# Patient Record
Sex: Male | Born: 1990 | Race: Black or African American | Hispanic: No | Marital: Single | Smoking: Former smoker
Health system: Southern US, Community
[De-identification: ages and names within clinical notes are randomized; demographics above are authoritative.]

## PROBLEM LIST (undated history)

## (undated) DIAGNOSIS — G4733 Obstructive sleep apnea (adult) (pediatric): Secondary | ICD-10-CM

## (undated) DIAGNOSIS — I10 Essential (primary) hypertension: Secondary | ICD-10-CM

## (undated) DIAGNOSIS — F419 Anxiety disorder, unspecified: Secondary | ICD-10-CM

## (undated) DIAGNOSIS — E785 Hyperlipidemia, unspecified: Secondary | ICD-10-CM

## (undated) DIAGNOSIS — R7989 Other specified abnormal findings of blood chemistry: Secondary | ICD-10-CM

## (undated) HISTORY — DX: Essential (primary) hypertension: I10

## (undated) HISTORY — DX: Hyperlipidemia, unspecified: E78.5

## (undated) HISTORY — DX: Obstructive sleep apnea (adult) (pediatric): G47.33

## (undated) HISTORY — DX: Other specified abnormal findings of blood chemistry: R79.89

---

## 2015-03-25 ENCOUNTER — Encounter (HOSPITAL_COMMUNITY): Payer: Self-pay | Admitting: *Deleted

## 2015-03-25 ENCOUNTER — Emergency Department (HOSPITAL_COMMUNITY)
Admission: EM | Admit: 2015-03-25 | Discharge: 2015-03-25 | Disposition: A | Discharge status: Home / Self Care | Payer: Managed Care, Other (non HMO) | Attending: Emergency Medicine | Admitting: Emergency Medicine

## 2015-03-25 DIAGNOSIS — X501XXA Overexertion from prolonged static or awkward postures, initial encounter: Secondary | ICD-10-CM | POA: Insufficient documentation

## 2015-03-25 DIAGNOSIS — F17200 Nicotine dependence, unspecified, uncomplicated: Secondary | ICD-10-CM | POA: Insufficient documentation

## 2015-03-25 DIAGNOSIS — Y9289 Other specified places as the place of occurrence of the external cause: Secondary | ICD-10-CM | POA: Diagnosis not present

## 2015-03-25 DIAGNOSIS — Y9389 Activity, other specified: Secondary | ICD-10-CM | POA: Diagnosis not present

## 2015-03-25 DIAGNOSIS — S3992XA Unspecified injury of lower back, initial encounter: Secondary | ICD-10-CM | POA: Diagnosis present

## 2015-03-25 DIAGNOSIS — Y990 Civilian activity done for income or pay: Secondary | ICD-10-CM | POA: Insufficient documentation

## 2015-03-25 DIAGNOSIS — S39012A Strain of muscle, fascia and tendon of lower back, initial encounter: Secondary | ICD-10-CM

## 2015-03-25 MED ORDER — TRAMADOL HCL 50 MG PO TABS
50.0000 mg | ORAL_TABLET | Freq: Once | ORAL | Status: AC
Start: 2015-03-25 — End: 2015-03-25
  Administered 2015-03-25: 50 mg via ORAL
  Filled 2015-03-25: qty 1

## 2015-03-25 MED ORDER — NAPROXEN 500 MG PO TABS: 500.0000 mg | ORAL_TABLET | Freq: Two times a day (BID) | ORAL | Status: DC

## 2015-03-25 MED ORDER — CYCLOBENZAPRINE HCL 10 MG PO TABS: 10.0000 mg | ORAL_TABLET | Freq: Two times a day (BID) | ORAL | Status: DC | PRN

## 2015-03-25 NOTE — ED Notes (Signed)
The pt is c/o back pain while at work  He has ahistory of back problems

## 2015-03-25 NOTE — Discharge Instructions (Signed)

## 2015-03-25 NOTE — ED Provider Notes (Signed)
CSN: [id number]     Arrival date & time 03/25/15  0425 History   First MD Initiated Contact with Patient 03/25/15 [phone]     Chief Complaint  Patient presents with  . Back Pain     (Consider location/radiation/quality/duration/timing/severity/associated sxs/prior Treatment) HPI  Patient to the ER with complaints of back pain. The pain started while he was at work. He works at a [employer] center and reports doing a lot of heavy lifting. He did not do any heaving lifting this evening however. He reports he was sitting down to watch the football game and went from sitting to standing when he developed a severe left low back pain. He reports hx of the same 2 years ago that took 2 months to get better. He is new to the area from out of town and does not have a doctor here. He does not have pain that radiates down his legs. He does not have numbness or tingling to his legs. His legs are not giving out nor do they feel weak or decreased sensation. He denies bowel or bladder incontinence. He denies any other symptoms.  History reviewed. No pertinent past medical history. History reviewed. No pertinent past surgical history. No family history on file. Social History  Substance Use Topics  . Smoking status: Current Every Day Smoker  . Smokeless tobacco: None  . Alcohol Use: Yes    Review of Systems  Review of Systems All other systems negative except as documented in the HPI. All pertinent positives and negatives as reviewed in the HPI.   Allergies  Review of patient's allergies indicates no known allergies.  Home Medications   Prior to Admission medications   Medication Sig Start Date End Date Taking? Authorizing Provider  cyclobenzaprine (FLEXERIL) 10 MG tablet Take 1 tablet (10 mg total) by mouth 2 (two) times daily as needed for muscle spasms. 03/25/15   Shaylon Aden Neva Seat, PA-C  naproxen (NAPROSYN) 500 MG tablet Take 1 tablet (500 mg total) by mouth 2 (two) times daily. 03/25/15    Cheyenna Pankowski Neva Seat, PA-C   BP 139/67 mmHg  Pulse 96  Temp(Src) 98.7 F (37.1 C) (Oral)  Resp 22  Wt 189.859 kg  SpO2 97% Physical Exam  Constitutional: He appears well-developed and well-nourished. No distress.  HENT:  Head: Normocephalic and atraumatic.  Eyes: Pupils are equal, round, and reactive to light.  Neck: Normal range of motion. Neck supple.  Cardiovascular: Normal rate and regular rhythm.   Pulmonary/Chest: Effort normal.  Abdominal: Soft.  Musculoskeletal:  Symmetrical and physiologic strength to bilateral lower extremities.  Neurosensory function adequate to both legs Skin color is normal. Skin is warm and moist.  No step off deformity appreciated and no midline bony tenderness.  Ambulatory  No crepitus, laceration, effusion, induration, lesions Pedal pulses are symmetrical and palpable bilaterally  No tenderness to midline, positive tenderness to paraspinal muscles  Neurological: He is alert.  Skin: Skin is warm and dry.  Nursing note and vitals reviewed.   ED Course  Procedures (including critical care time) Labs Review Labs Reviewed - No data to display  Imaging Review No results found. I have personally reviewed and evaluated these images and lab results as part of my medical decision-making.   EKG Interpretation None      MDM   Final diagnoses:  Lumbar strain, initial encounter   25 y.o.Alexander [name]'s  with back pain.   No neurological deficits and normal neuro exam. No loss of bowel or bladder control.  No concern for cauda equina at this time base on HPI and physical exam findings. No fever, night sweats, weight loss, h/o cancer, IVDU. The patient can walk with some discomfort.   Patient Plan 1. Medications: NSAIDs and/or muscle relaxer. Cont usual home medications unless otherwise directed. 2. Treatment: rest, drink plenty of fluids, gentle stretching as discussed, alternate ice and heat  3. Follow Up: Please followup with your primary  doctor for discussion of your diagnoses and further evaluation after today's visit; if you do not have a primary care doctor use the resource guide provided to find one  Advised to follow-up with the orthopedist if symptoms do not start to resolve in the next 2-3 days. If develop loss of bowel or urinary control return to the ED as soon as possible for further evaluation. To take the medications as prescribed as they can cause harm if not taken appropriately.   Vital signs are stable at discharge. Filed Vitals:   03/25/15 0429  BP: 139/67  Pulse: 96  Temp: 98.7 F (37.1 C)  Resp: 22    Patient/guardian has voiced understanding and agreed to follow-up with the PCP or specialist.         Marlon Pel, PA-C 03/25/15 1610  Shon Baton, MD 03/27/15 [phone]

## 2015-03-29 ENCOUNTER — Emergency Department (HOSPITAL_COMMUNITY)
Admission: EM | Admit: 2015-03-29 | Discharge: 2015-03-29 | Disposition: A | Discharge status: Home / Self Care | Payer: Managed Care, Other (non HMO) | Attending: Emergency Medicine | Admitting: Emergency Medicine

## 2015-03-29 ENCOUNTER — Encounter (HOSPITAL_COMMUNITY): Payer: Self-pay | Admitting: Nurse Practitioner

## 2015-03-29 DIAGNOSIS — F17200 Nicotine dependence, unspecified, uncomplicated: Secondary | ICD-10-CM | POA: Insufficient documentation

## 2015-03-29 DIAGNOSIS — M5441 Lumbago with sciatica, right side: Secondary | ICD-10-CM | POA: Diagnosis not present

## 2015-03-29 DIAGNOSIS — M545 Low back pain: Secondary | ICD-10-CM | POA: Diagnosis present

## 2015-03-29 DIAGNOSIS — Z791 Long term (current) use of non-steroidal anti-inflammatories (NSAID): Secondary | ICD-10-CM | POA: Diagnosis not present

## 2015-03-29 NOTE — Discharge Instructions (Signed)
Mr. Alexander [name],  Nice meeting you! Please follow-up with a primary care provider. Return to the emergency department if these control of your bowel or bladder, develop fevers, chills, inability to walk. Feel better soon!  S. Lane Hacker, PA-C Back Exercises The following exercises strengthen the muscles that help to support the back. They also help to keep the lower back flexible. Doing these exercises can help to prevent back pain or lessen existing pain. If you have back pain or discomfort, try doing these exercises 2-3 times each day or as told by your health care provider. When the pain goes away, do them once each day, but increase the number of times that you repeat the steps for each exercise (do more repetitions). If you do not have back pain or discomfort, do these exercises once each day or as told by your health care provider. EXERCISES Single Knee to Chest Repeat these steps 3-5 times for each leg:  Lie on your back on a firm bed or the floor with your legs extended.  Bring one knee to your chest. Your other leg should stay extended and in contact with the floor.  Hold your knee in place by grabbing your knee or thigh.  Pull on your knee until you feel a gentle stretch in your lower back.  Hold the stretch for 10-30 seconds.  Slowly release and straighten your leg. Pelvic Tilt Repeat these steps 5-10 times:  Lie on your back on a firm bed or the floor with your legs extended.  Bend your knees so they are pointing toward the ceiling and your feet are flat on the floor.  Tighten your lower abdominal muscles to press your lower back against the floor. This motion will tilt your pelvis so your tailbone points up toward the ceiling instead of pointing to your feet or the floor.  With gentle tension and even breathing, hold this position for 5-10 seconds. Cat-Cow Repeat these steps until your lower back becomes more flexible:  Get into a hands-and-knees position on a  firm surface. Keep your hands under your shoulders, and keep your knees under your hips. You may place padding under your knees for comfort.  Let your head hang down, and point your tailbone toward the floor so your lower back becomes rounded like the back of a cat.  Hold this position for 5 seconds.  Slowly lift your head and point your tailbone up toward the ceiling so your back forms a sagging arch like the back of a cow.  Hold this position for 5 seconds. Press-Ups Repeat these steps 5-10 times: 1. Lie on your abdomen (face-down) on the floor. 2. Place your palms near your head, about shoulder-width apart. 3. While you keep your back as relaxed as possible and keep your hips on the floor, slowly straighten your arms to raise the top half of your body and lift your shoulders. Do not use your back muscles to raise your upper torso. You may adjust the placement of your hands to make yourself more comfortable. 4. Hold this position for 5 seconds while you keep your back relaxed. 5. Slowly return to lying flat on the floor. Bridges Repeat these steps 10 times: 1. Lie on your back on a firm surface. 2. Bend your knees so they are pointing toward the ceiling and your feet are flat on the floor. 3. Tighten your buttocks muscles and lift your buttocks off of the floor until your waist is at almost the same height  as your knees. You should feel the muscles working in your buttocks and the back of your thighs. If you do not feel these muscles, slide your feet 1-2 inches farther away from your buttocks. 4. Hold this position for 3-5 seconds. 5. Slowly lower your hips to the starting position, and allow your buttocks muscles to relax completely. If this exercise is too easy, try doing it with your arms crossed over your chest. Abdominal Crunches Repeat these steps 5-10 times: 1. Lie on your back on a firm bed or the floor with your legs extended. 2. Bend your knees so they are pointing toward the  ceiling and your feet are flat on the floor. 3. Cross your arms over your chest. 4. Tip your chin slightly toward your chest without bending your neck. 5. Tighten your abdominal muscles and slowly raise your trunk (torso) high enough to lift your shoulder blades a tiny bit off of the floor. Avoid raising your torso higher than that, because it can put too much stress on your low back and it does not help to strengthen your abdominal muscles. 6. Slowly return to your starting position. Back Lifts Repeat these steps 5-10 times: 1. Lie on your abdomen (face-down) with your arms at your sides, and rest your forehead on the floor. 2. Tighten the muscles in your legs and your buttocks. 3. Slowly lift your chest off of the floor while you keep your hips pressed to the floor. Keep the back of your head in line with the curve in your back. Your eyes should be looking at the floor. 4. Hold this position for 3-5 seconds. 5. Slowly return to your starting position. SEEK MEDICAL CARE IF:  Your back pain or discomfort gets much worse when you do an exercise.  Your back pain or discomfort does not lessen within 2 hours after you exercise. If you have any of these problems, stop doing these exercises right away. Do not do them again unless your health care provider says that you can. SEEK IMMEDIATE MEDICAL CARE IF:  You develop sudden, severe back pain. If this happens, stop doing the exercises right away. Do not do them again unless your health care provider says that you can.   This information is not intended to replace advice given to you by your health care provider. Make sure you discuss any questions you have with your health care provider.   Document Released: 03/12/2004 Document Revised: 10/24/2014 Document Reviewed: 03/29/2014 Elsevier Interactive Patient Education 2016 [employer]  Chronic Back Pain  When back pain lasts longer than 3 months, it is called chronic back pain.People with  chronic back pain often go through certain periods that are more intense (flare-ups).  CAUSES Chronic back pain can be caused by wear and tear (degeneration) on different structures in your back. These structures include:  The bones of your spine (vertebrae) and the joints surrounding your spinal cord and nerve roots (facets).  The strong, fibrous tissues that connect your vertebrae (ligaments). Degeneration of these structures may result in pressure on your nerves. This can lead to constant pain. HOME CARE INSTRUCTIONS  Avoid bending, heavy lifting, prolonged sitting, and activities which make the problem worse.  Take brief periods of rest throughout the day to reduce your pain. Lying down or standing usually is better than sitting while you are resting.  Take over-the-counter or prescription medicines only as directed by your caregiver. SEEK IMMEDIATE MEDICAL CARE IF:   You have weakness or numbness in  one of your legs or feet.  You have trouble controlling your bladder or bowels.  You have nausea, vomiting, abdominal pain, shortness of breath, or fainting.   This information is not intended to replace advice given to you by your health care provider. Make sure you discuss any questions you have with your health care provider.   Document Released: 03/12/2004 Document Revised: 04/27/2011 Document Reviewed: 07/23/2014 Elsevier Interactive Patient Education [employer].

## 2015-03-29 NOTE — ED Notes (Signed)
See PA Assessment  

## 2015-03-29 NOTE — ED Notes (Signed)
He was here Sunday for back pain and discharged home with naproxen and flexeril. He has been taking the meds with minimal pain relief and states he is unable to go back to work due to pain. He is ambulatory, mae.

## 2015-03-29 NOTE — ED Provider Notes (Signed)
CSN: [id number]     Arrival date & time 03/29/15  1815 History  By signing my name below, I, Alexander [name], attest that this documentation has been prepared under the direction and in the presence of Melton Krebs PA-C. Electronically Signed: Placido [name], ED Scribe. 03/29/2015. 6:48 PM.    Chief Complaint  Patient presents with  . Back Pain    The history is provided by the patient. No language interpreter was used.    HPI Comments: Alexander [name] is a 25 y.o. male who presents to the Emergency Department complaining of waxing and waning, moderate, right lower back pain with onset 1 week ago. He describes his pain as 8/10 pain scale, constant. Pt was seen 4 days ago and discharged with naproxen and flexeril which he states has provided little relief and has caused him to continue to be out of work. His pain worsens when sitting erect or to the right and alleviates slightly while ambulating. He notes a radiation of pain to his right hip. He works at a distribution center doing heavy lifting. Pt notes a PMHx of lumbar strain 2 years ago. He denies any associated symptoms at this time.   No past medical history on file. No past surgical history on file. No family history on file. Social History  Substance Use Topics  . Smoking status: Current Every Day Smoker  . Smokeless tobacco: Not on file  . Alcohol Use: Yes    Review of Systems A complete 10 system review of systems was obtained and all systems are negative except as noted in the HPI and PMH.     Allergies  Review of patient's allergies indicates no known allergies.  Home Medications   Prior to Admission medications   Medication Sig Start Date End Date Taking? Authorizing Provider  cyclobenzaprine (FLEXERIL) 10 MG tablet Take 1 tablet (10 mg total) by mouth 2 (two) times daily as needed for muscle spasms. 03/25/15   Tiffany Neva Seat, PA-C  naproxen (NAPROSYN) 500 MG tablet Take 1 tablet (500 mg total) by mouth 2 (two)  times daily. 03/25/15   Tiffany Neva Seat, PA-C   BP 155/85 mmHg  Pulse 92  Temp(Src) 98 F (36.7 C) (Oral)  Resp 18  Ht  (1.93 m)  Wt 415 lb (188.243 kg)  BMI 50.54 kg/m2  SpO2 96% Physical Exam Constitutional: Pt appears well-developed and well-nourished. No distress.  HENT:  Head: Normocephalic and atraumatic.  Mouth/Throat: Oropharynx is clear and moist. No oropharyngeal exudate.  Eyes: Conjunctivae are normal.  Neck: Normal range of motion. Neck supple.  Full ROM without pain  Cardiovascular: Normal rate, regular rhythm and intact distal pulses.   Pulmonary/Chest: Effort normal and breath sounds normal. No respiratory distress. Pt has no wheezes.  Abdominal: Soft. Pt exhibits no distension. There is no tenderness.  Musculoskeletal:  Full range of motion of the T-spine and L-spine No tenderness to palpation of the spinous processes of the T-spine or L-spine Mild tenderness to palpation of the paraspinous muscles of the L-spine. Tenderness over the right SI joint.   Lymphadenopathy:    Pt has no cervical adenopathy.  Neurological: Pt is alert. Pt has normal reflexes.  Reflex Scores:      Bicep reflexes are 2+ on the right side and 2+ on the left side.      Brachioradialis reflexes are 2+ on the right side and 2+ on the left side.      Patellar reflexes are 2+ on the right side and  2+ on the left side.      Achilles reflexes are 2+ on the right side and 2+ on the left side. Speech is clear and goal oriented, follows commands Normal 5/5 strength in upper and lower extremities bilaterally including dorsiflexion and plantar flexion, strong and equal grip strength Sensation normal to light and sharp touch Moves extremities without ataxia, coordination intact Normal gait Normal balance No Clonus  Skin: Skin is warm and dry. No rash noted. Pt is not diaphoretic. No erythema.  Psychiatric: Pt has a normal mood and affect. Behavior is normal.  Nursing note and vitals  reviewed.   ED Course  Procedures  DIAGNOSTIC STUDIES: Oxygen Saturation is 96% on RA, normal by my interpretation.    COORDINATION OF CARE: 6:44 PM Discussed next steps with pt. He verbalized understanding and is agreeable with the plan.   MDM   Final diagnoses:  Low back pain with right-sided sciatica, unspecified back pain laterality   Patient with back pain.  No neurological deficits and normal neuro exam.  Patient is ambulatory.  No loss of bowel or bladder control.  No concern for cauda equina.  No fever, night sweats, weight loss, h/o cancer, IVDA, no recent procedure to back. No urinary symptoms suggestive of UTI.  Supportive care and return precaution discussed. Appears safe for discharge at this time. Follow up as indicated in discharge paperwork.   I personally performed the services described in this documentation, which was scribed in my presence. The recorded information has been reviewed and is accurate.    Melton Krebs, PA-C 03/30/15 1191  Benjiman Core, MD 03/30/15 [phone]

## 2015-10-10 ENCOUNTER — Ambulatory Visit (HOSPITAL_COMMUNITY)
Admission: EM | Admit: 2015-10-10 | Discharge: 2015-10-10 | Disposition: A | Discharge status: Home / Self Care | Payer: Managed Care, Other (non HMO) | Attending: Emergency Medicine | Admitting: Emergency Medicine

## 2015-10-10 ENCOUNTER — Encounter (HOSPITAL_COMMUNITY): Payer: Self-pay | Admitting: Emergency Medicine

## 2015-10-10 DIAGNOSIS — K529 Noninfective gastroenteritis and colitis, unspecified: Secondary | ICD-10-CM | POA: Diagnosis not present

## 2015-10-10 MED ORDER — ONDANSETRON 4 MG PO TBDP
ORAL_TABLET | ORAL | Status: AC
  Filled 2015-10-10: qty 1

## 2015-10-10 MED ORDER — ONDANSETRON HCL 4 MG PO TABS: 4.0000 mg | ORAL_TABLET | Freq: Three times a day (TID) | ORAL | 0 refills | Status: DC | PRN

## 2015-10-10 MED ORDER — ONDANSETRON HCL 4 MG/2ML IJ SOLN: 4.0000 mg | Freq: Once | INTRAMUSCULAR | Status: DC

## 2015-10-10 MED ORDER — ONDANSETRON 4 MG PO TBDP
4.0000 mg | ORAL_TABLET | Freq: Once | ORAL | Status: AC
Start: 2015-10-10 — End: 2015-10-10
  Administered 2015-10-10: 4 mg via ORAL

## 2015-10-10 NOTE — ED Provider Notes (Signed)
CSN: [id number]     Arrival date & time 10/10/15  1628 History   First MD Initiated Contact with Patient 10/10/15 1735     Chief Complaint  Patient presents with  . Abdominal Pain   (Consider location/radiation/quality/duration/timing/severity/associated sxs/prior Treatment) 25 year old male presents with nausea, vomiting and abdominal cramping that started last night. No fever. Daughter had similar symptoms yesterday and is recovering and feeling better today. He has not been able to keep down any food or fluids today. Takes no daily medication and no chronic health issues. However, his blood pressure is elevated today with no history of elevated BP. He recently quit smoking within the past 8 months but has gained weight.    The history is provided by the patient.    History reviewed. No pertinent past medical history. History reviewed. No pertinent surgical history. History reviewed. No pertinent family history. Social History  Substance Use Topics  . Smoking status: Former Smoker    Packs/day: 1.00    Years: 10.00    Types: Cigarettes    Quit date: 03/12/2015  . Smokeless tobacco: Never Used  . Alcohol use Yes    Review of Systems  Constitutional: Positive for fatigue. Negative for chills and fever.  Respiratory: Negative for shortness of breath.   Cardiovascular: Negative for chest pain.  Gastrointestinal: Positive for diarrhea, nausea and vomiting. Negative for blood in stool and constipation. Abdominal pain: cramping.  Genitourinary: Negative for dysuria.  Neurological: Negative for dizziness and headaches.    Allergies  Review of patient's allergies indicates no known allergies.  Home Medications   Prior to Admission medications   Medication Sig Start Date End Date Taking? Authorizing Provider  ondansetron (ZOFRAN) 4 MG tablet Take 1 tablet (4 mg total) by mouth every 8 (eight) hours as needed for nausea or vomiting. 10/10/15   Sudie GrumblingAnn Berry Vijay Durflinger, NP   Meds Ordered and  Administered this Visit   Medications  ondansetron (ZOFRAN-ODT) disintegrating tablet 4 mg (4 mg Oral Given 10/10/15 1755)    BP 145/81 (BP Location: Right Arm)   Pulse 82   Temp 98.4 F (36.9 C) (Oral)   Resp 18   SpO2 95%  No data found.   Physical Exam  Constitutional: He is oriented to person, place, and time. He appears well-developed and well-nourished. No distress.  HENT:  Head: Normocephalic and atraumatic.  Nose: Nose normal.  Mouth/Throat: Uvula is midline, oropharynx is clear and moist and mucous membranes are normal.  Neck: Normal range of motion. Neck supple.  Cardiovascular: Normal rate, regular rhythm and normal heart sounds.   Pulmonary/Chest: Effort normal and breath sounds normal.  Abdominal: Soft. Normal appearance and bowel sounds are normal. There is no tenderness. There is no rigidity and no CVA tenderness.  Lymphadenopathy:    He has no cervical adenopathy.  Neurological: He is alert and oriented to person, place, and time.  Skin: Skin is warm and dry. Capillary refill takes less than 2 seconds.  Psychiatric: He has a normal mood and affect. His behavior is normal. Judgment and thought content normal.    Urgent Care Course   Clinical Course    Procedures (including critical care time)  Labs Review Labs Reviewed - No data to display  Imaging Review No results found.   Visual Acuity Review  Right Eye Distance:   Left Eye Distance:   Bilateral Distance:    Right Eye Near:   Left Eye Near:    Bilateral Near:  MDM   1. Gastroenteritis    Discussed that symptoms probably are due to a virus. Recommend Zofran 8mg  IM today- however when nurse came in to give shot- pt declined and was given Zofran 4mg  oral tablet instead. May continue Zofran 4mg  every 8 hours as needed. Slowly increase fluids and advance diet as tolerated. Diarrhea should resolve on own without medication. Go to ER if vomiting or abdominal pain worsens. We rechecked  BP at end of visit- had improved to 145/81. Encouraged to continue to monitor and increase exercise to help lose weight. Follow-up with a primary care provider if blood pressure continues to remain elevated or if he needs additional assistance with weight loss.      Sudie GrumblingAnn Berry Etsuko Dierolf, NP 10/11/15 [phone]

## 2015-10-10 NOTE — ED Triage Notes (Signed)
[facility] with a complaint of mild abdominal pain with N/V/D that started last night.

## 2015-10-11 NOTE — Discharge Instructions (Signed)
You were given Zofran tablet today to help with nausea and vomiting. You may continue this medication every 8 hours as needed. Slowly increase fluids and start with bland diet. Continue to monitor blood pressure and follow-up with a primary care provider if blood pressure remains elevated or you need additional assistance to help with weight loss.

## 2016-07-01 ENCOUNTER — Encounter (HOSPITAL_COMMUNITY): Payer: Self-pay | Admitting: *Deleted

## 2016-07-01 ENCOUNTER — Emergency Department (HOSPITAL_COMMUNITY)
Admission: EM | Admit: 2016-07-01 | Discharge: 2016-07-01 | Disposition: A | Discharge status: Home / Self Care | Payer: Managed Care, Other (non HMO) | Attending: Emergency Medicine | Admitting: Emergency Medicine

## 2016-07-01 ENCOUNTER — Emergency Department (HOSPITAL_COMMUNITY): Payer: Managed Care, Other (non HMO)

## 2016-07-01 DIAGNOSIS — R0789 Other chest pain: Secondary | ICD-10-CM | POA: Insufficient documentation

## 2016-07-01 DIAGNOSIS — Z87891 Personal history of nicotine dependence: Secondary | ICD-10-CM | POA: Diagnosis not present

## 2016-07-01 HISTORY — DX: Anxiety disorder, unspecified: F41.9

## 2016-07-01 LAB — CBC
HEMATOCRIT: 45.2 % (ref 39.0–52.0)
Hemoglobin: 14.6 g/dL (ref 13.0–17.0)
MCH: 25.6 pg — ABNORMAL LOW (ref 26.0–34.0)
MCHC: 32.3 g/dL (ref 30.0–36.0)
MCV: 79.3 fL (ref 78.0–100.0)
PLATELETS: 231 10*3/uL (ref 150–400)
RBC: 5.7 MIL/uL (ref 4.22–5.81)
RDW: 14.5 % (ref 11.5–15.5)
WBC: 6.7 10*3/uL (ref 4.0–10.5)

## 2016-07-01 LAB — BASIC METABOLIC PANEL
ANION GAP: 10 (ref 5–15)
BUN: 7 mg/dL (ref 6–20)
CHLORIDE: 104 mmol/L (ref 101–111)
CO2: 23 mmol/L (ref 22–32)
Calcium: 9.2 mg/dL (ref 8.9–10.3)
Creatinine, Ser: 0.94 mg/dL (ref 0.61–1.24)
Glucose, Bld: 99 mg/dL (ref 65–99)
POTASSIUM: 3.8 mmol/L (ref 3.5–5.1)
SODIUM: 137 mmol/L (ref 135–145)

## 2016-07-01 LAB — I-STAT TROPONIN, ED: Troponin i, poc: 0 ng/mL (ref 0.00–0.08)

## 2016-07-01 LAB — D-DIMER, QUANTITATIVE: D-Dimer, Quant: 0.29 ug/mL-FEU (ref 0.00–0.50)

## 2016-07-01 MED ORDER — LORAZEPAM 1 MG PO TABS
1.0000 mg | ORAL_TABLET | Freq: Once | ORAL | Status: AC
  Administered 2016-07-01: 1 mg via ORAL
  Filled 2016-07-01: qty 1

## 2016-07-01 MED ORDER — LORAZEPAM 1 MG PO TABS: 1.0000 mg | ORAL_TABLET | Freq: Two times a day (BID) | ORAL | 0 refills | Status: DC | PRN

## 2016-07-01 NOTE — ED Notes (Signed)
We called lab to get the 2nd blood test was unable to get it

## 2016-07-01 NOTE — ED Provider Notes (Signed)
[facility]-EMER[facility]ENCY DEPT Provider Note   CSN: [id number] Arrival date & time: 07/01/16  1252  By signing my name below, I, [name], attest that this documentation has been prepared under the direction and in the presence of Jacalyn Lefevre, MD. Electronically Signed: [name], Scribe. 07/01/2016. 3:52 PM.   History   Chief Complaint Chief Complaint  Patient presents with  . Chest Pain   The history is provided by the patient and medical records. No language interpreter was used.   HPI Comments:  Alexander [name] is an obese 26 y.o. male with a PMHx of Anxiety, who presents to the Emergency Department complaining of gradually improving, sharp, centralized, CP onset this afternoon at work around 12:00PM. Pt reports CP arising during exertional work described as sharp. He notes his left arm went numb alongside some SOB during this episode. He did no try anything to relieve the pain PTA but states his pain is no longer present at the current time. Pt denies abdominal pain, and any other complaints at this time. Pt just started smoking again.   Past Medical History:  Diagnosis Date  . Anxiety   . Obesity    There are no active problems to display for this patient.  History reviewed. No pertinent surgical history.  Home Medications    Prior to Admission medications   Medication Sig Start Date End Date Taking? Authorizing Provider  LORazepam (ATIVAN) 1 MG tablet Take 1 tablet (1 mg total) by mouth 2 (two) times daily as needed for anxiety. 07/01/16   Jacalyn Lefevre, MD  ondansetron (ZOFRAN) 4 MG tablet Take 1 tablet (4 mg total) by mouth every 8 (eight) hours as needed for nausea or vomiting. 10/10/15   Amyot, Ali Lowe, NP   Family History History reviewed. No pertinent family history.  Social History Social History  Substance Use Topics  . Smoking status: Former Smoker    Packs/day: 1.00    Years: 10.00    Types: Cigarettes    Quit date: 03/12/2015  . Smokeless  tobacco: Never Used  . Alcohol use Yes   Allergies   Patient has no known allergies.   Review of Systems Review of Systems All systems reviewed and are negative for acute change except as noted in the HPI.    Physical Exam Updated Vital Signs BP 138/65 (BP Location: Right Arm)   Pulse (!) 110   Temp 98.3 F (36.8 C) (Oral)   Resp 20   SpO2 97%   Physical Exam  Constitutional: He is oriented to person, place, and time. He appears well-developed and well-nourished.  HENT:  Head: Normocephalic.  Eyes: Conjunctivae are normal.  Cardiovascular: Tachycardia present.   Pulmonary/Chest: Effort normal.  Abdominal: He exhibits no distension.  Musculoskeletal: Normal range of motion.  Neurological: He is alert and oriented to person, place, and time.  Skin: Skin is warm and dry.  Psychiatric: He has a normal mood and affect.  Nursing note and vitals reviewed.  ED Treatments / Results  DIAGNOSTIC STUDIES:  Oxygen Saturation is 95% on RA, adequate by my interpretation.    COORDINATION OF CARE:  3:50 PM Discussed treatment plan with pt at bedside including blood work, CXR, EKG, and pt agreed to plan.  Labs (all labs ordered are listed, but only abnormal results are displayed) Labs Reviewed  CBC - Abnormal; Notable for the following:       Result Value   MCH 25.6 (*)    All other components within normal limits  BASIC METABOLIC PANEL  D-DIMER, QUANTITATIVE (NOT AT [facility])  TROPONIN I  I-STAT TROPOININ, ED    EKG  EKG Interpretation  Date/Time:  Wednesday Jul 01 2016 13:04:38 EDT Ventricular Rate:  108 PR Interval:  184 QRS Duration: 90 QT Interval:  320 QTC Calculation: 428 R Axis:   109 Text Interpretation:  Sinus tachycardia Rightward axis Cannot rule out Anterior infarct , age undetermined T wave abnormality, consider inferior ischemia Abnormal ECG No old tracing to compare Confirmed by [facility] MD, Arrianna Catala [phone]([id number]) on 07/01/2016 4:24:37 PM        Radiology Dg  Chest 2 View  Result Date: 07/01/2016 CLINICAL DATA:  Left-sided chest pain.  Shortness breath. EXAM: CHEST  2 VIEW COMPARISON:  No prior. FINDINGS: Mediastinum and hilar structures are normal. Borderline cardiomegaly. Normal pulmonary vascularity. No focal infiltrate. No pleural effusion or pneumothorax . IMPRESSION: 1.  Borderline cardiomegaly.  No pulmonary venous congestion. 2. No focal infiltrate. Electronically Signed   By: Maisie Fushomas  Register   On: 07/01/2016 13:26    Procedures Procedures (including critical care time)  Medications Ordered in ED Medications  LORazepam (ATIVAN) tablet 1 mg (1 mg Oral Given 07/01/16 1706)     Initial Impression / Assessment and Plan / ED Course  I have reviewed the triage vital signs and the nursing notes.  Pertinent labs & imaging results that were available during my care of the patient were reviewed by me and considered in my medical decision making (see chart for details).   Pt's tachycardia has improved.  Pt has been dealing with a recent breakup of 6 years.  Cp likely related to stress.  Pt did not want a 2nd troponin drawn.  He is low risk for cad.  He knows to return if worse.   Final Clinical Impressions(s) / ED Diagnoses   Final diagnoses:  Atypical chest pain    New Prescriptions New Prescriptions   LORAZEPAM (ATIVAN) 1 MG TABLET    Take 1 tablet (1 mg total) by mouth 2 (two) times daily as needed for anxiety.   I personally performed the services described in this documentation, which was scribed in my presence. The recorded information has been reviewed and is accurate.     Jacalyn LefevreHaviland, Zalmen Wrightsman, MD 07/01/16 [phone]

## 2016-07-01 NOTE — ED Notes (Signed)
Declined W/C at D/C and was escorted to lobby by RN. 

## 2016-07-01 NOTE — ED Triage Notes (Signed)
Pt reports onset this am of mid sharp chest pains, had sob when pain started but sob has since resolved. No acute distress is noted at this time.

## 2016-09-16 NOTE — Progress Notes (Signed)
Alexander [name] is a 26 y.o. male here to Establish Care and discuss referral for sleep study.  I acted as a Neurosurgeonscribe for Energy East CorporationSamantha Quran Vasco, PA-C Alexander Mullonna Orphanos, LPN  History of Present Illness:   Chief Complaint  Patient presents with  . Establish Care    [facility]  . Needs referral for Sleep Study  . Snoring    Acute Concerns: Sleep apnea -- hx of "severe" obstructive sleep apnea, dx in July 2016 and was told to get a CPAP but never did pick it up. Having daytime somnolence.  Currently works 1st shift, was on 3rd shift. He is very motivated to start using CPAP again. Recently stopped smoking last month. Morbid obesity -- has lost 35 lb in about 3 months. In March was up 440 lb, got down to 400 lb.  Lost it due to stress and working out. In 2008 (when in high school) was around 315 lb.  Health Maintenance: Weight -- Weight: (!) 417 lb (189.1 kg)    Depression screen PHQ 2/9 09/17/2016  Decreased Interest 0  Down, Depressed, Hopeless 0  PHQ - 2 Score 0    No flowsheet data found.  Other providers/specialists: Does get regular care from a dentist   Past Medical History:  Diagnosis Date  . Anxiety   . Obesity   . Snoring      Social History   Social History  . Marital status: Single    Spouse name: N/A  . Number of children: N/A  . Years of education: N/A   Occupational History  . Not on file.   Social History Main Topics  . Smoking status: Former Smoker    Packs/day: 1.00    Years: 10.00    Types: Cigarettes    Quit date: 07/17/2016  . Smokeless tobacco: Never Used     Comment: Hookah every other day  . Alcohol use 1.8 - 2.4 oz/week    3 - 4 Cans of beer per week  . Drug use: No  . Sexual activity: Yes   Other Topics Concern  . Not on file   Social History Narrative   [employer]   Daughter, 4 y/o -- sees 3-4 times a week       History reviewed. No pertinent surgical history.  Family History  Problem Relation Age of Onset  . Heart  disease Maternal Grandfather   . Hypertension Maternal Grandfather     No Known Allergies   Current Medications:   Current Outpatient Prescriptions:  Marland Kitchen.  Green Tea, Camillia sinensis, (GREEN TEA PO), Take 3 capsules by mouth daily., Disp: , Rfl:    Review of Systems:   Review of Systems  Constitutional: Positive for malaise/fatigue. Negative for chills, fever and weight loss.  Respiratory: Negative for cough and shortness of breath.   Cardiovascular: Negative for chest pain, palpitations and leg swelling.  Neurological: Negative for dizziness and headaches.  Psychiatric/Behavioral: Negative for depression. The patient does not have insomnia.     Vitals:   Vitals:   09/17/16 1522  BP: 130/90  Pulse: 89  Temp: 98.5 F (36.9 C)  TempSrc: Oral  SpO2: 95%  Weight: (!) 417 lb (189.1 kg)  Height: 6' 0.5" (1.842 m)     Body mass index is 55.78 kg/m.  Physical Exam:   Physical Exam  Constitutional: He appears well-developed. He is cooperative.  Non-toxic appearance. He does not have a sickly appearance. He does not appear ill. No distress.  Cardiovascular: Normal rate,  regular rhythm, S1 normal, S2 normal, normal heart sounds and normal pulses.   No LE edema  Pulmonary/Chest: Effort normal and breath sounds normal.  Neurological: He is alert. GCS eye subscore is 4. GCS verbal subscore is 5. GCS motor subscore is 6.  Skin: Skin is warm, dry and intact.  Psychiatric: He has a normal mood and affect. His speech is normal and behavior is normal.  Nursing note and vitals reviewed.     Assessment and Plan:    [name] was seen today for establish care, needs referral for sleep study and snoring.  Diagnoses and all orders for this visit:  Obstructive sleep apnea and Morbid obesity (HCC) Needs repeat, updated sleep study and new prescription for CPAP. Encouraged continued weight loss, follow-up with us as able for a physical and weight loss discussion. -     Home sleep test;  Future    . Reviewed expectations re: course of current medical issues. . Discussed self-management of symptoms. . Outlined signs and symptoms indicating need for more acute intervention. . Patient verbalized understanding and all questions were answered. . See orders for this visit as documented in the electronic medical record. . Patient received an After-Visit Summary.  CMA or LPN served as scribe during this visit. History, Physical, and Plan performed by medical provider. Documentation and orders reviewed and attested to.  Alexander MottoSamantha Clemence Lengyel, PA-C

## 2016-09-17 ENCOUNTER — Ambulatory Visit (INDEPENDENT_AMBULATORY_CARE_PROVIDER_SITE_OTHER): Payer: Managed Care, Other (non HMO) | Admitting: Physician Assistant

## 2016-09-17 ENCOUNTER — Encounter: Payer: Self-pay | Admitting: Physician Assistant

## 2016-09-17 VITALS — BP 130/90 | HR 89 | Temp 98.5°F | Ht 72.5 in | Wt >= 6400 oz

## 2016-09-17 DIAGNOSIS — F419 Anxiety disorder, unspecified: Secondary | ICD-10-CM | POA: Insufficient documentation

## 2016-09-17 DIAGNOSIS — G4733 Obstructive sleep apnea (adult) (pediatric): Secondary | ICD-10-CM | POA: Diagnosis not present

## 2016-09-17 NOTE — Patient Instructions (Signed)
It was great to meet you!  You will be contacted about your sleep study referral. If you have not been contacted about your referral in 1-2 weeks, please let us know.  Please come back for a physical at your convenience.

## 2016-10-14 ENCOUNTER — Telehealth: Payer: Self-pay | Admitting: Internal Medicine

## 2016-10-14 NOTE — Telephone Encounter (Signed)
Initially called [employer] to receive pre-cert when first routed order- I was informed none was need. I called [employer] again this morning and after a lengthy call I got the sleep study clarified, and into review with [employer]. Reference number for [employer]- [id number]. Awaiting approval from [facility] now.

## 2016-10-14 NOTE — Telephone Encounter (Signed)
Patient was told by Pulmonary he needs precertification before he can be scheduled for sleep test.  This was ordered 08/02 by Sam.  Ty,  -LL

## 2016-10-14 NOTE — Telephone Encounter (Signed)
Pulmonary calling to inform us that the patient needs precertification information sent to them before patient can be scheduled.  See below.  Ty,  -LL

## 2016-10-14 NOTE — Telephone Encounter (Signed)
Alexander MottoSamantha Worley PA has put in order for sleep study but we haven't been notified by her office that the study has been precerted.  I called pt & explained to him that normally order is placed by the provider & then their office will get study precerted & then contact our office to let us know order is there & they ask us to schedule pt.  I told him we have not been [facility].  I explained he can call her office & let them know we haven't gotten any type of notification for them & we will be glad to schedule the study once it has been precerted.  I called [facility] myself & spoke to [employer].  She is going to make new referral coordinator aware of the process.  Nothing further needed.

## 2016-10-23 ENCOUNTER — Telehealth: Payer: Self-pay | Admitting: Internal Medicine

## 2016-10-23 NOTE — Telephone Encounter (Signed)
Last [facility] since it was dealing with scheduling and precert, so will route to them, thanks

## 2016-10-23 NOTE — Telephone Encounter (Signed)
Called the patient and  Let him know we are still waiting on his sleep study order from his pcp he wanted to be sure we have his new number and I told him we would call once we got it.

## 2016-11-05 DIAGNOSIS — G4733 Obstructive sleep apnea (adult) (pediatric): Secondary | ICD-10-CM | POA: Diagnosis not present

## 2016-11-06 DIAGNOSIS — G4733 Obstructive sleep apnea (adult) (pediatric): Secondary | ICD-10-CM | POA: Diagnosis not present

## 2016-11-09 ENCOUNTER — Other Ambulatory Visit: Payer: Self-pay | Admitting: *Deleted

## 2016-11-09 DIAGNOSIS — G4733 Obstructive sleep apnea (adult) (pediatric): Secondary | ICD-10-CM

## 2016-11-11 ENCOUNTER — Other Ambulatory Visit: Payer: Self-pay | Admitting: Physician Assistant

## 2016-11-11 DIAGNOSIS — G4733 Obstructive sleep apnea (adult) (pediatric): Secondary | ICD-10-CM

## 2016-11-23 ENCOUNTER — Telehealth: Payer: Self-pay | Admitting: Physician Assistant

## 2016-11-23 NOTE — Telephone Encounter (Signed)
Mellody Dance can you look into this for patient.

## 2016-11-23 NOTE — Telephone Encounter (Signed)
Patient calling to check the status of his sleep study.  I advised the patient that I did see the order in the system and that I would document the note and transferred the call to the referral coordinators voicemail also to advise.

## 2016-12-02 ENCOUNTER — Telehealth: Payer: Self-pay | Admitting: Internal Medicine

## 2016-12-02 ENCOUNTER — Telehealth: Payer: Self-pay | Admitting: Physician Assistant

## 2016-12-02 NOTE — Telephone Encounter (Signed)
DME order completed and sent to [employer]. They will contact patient to continue process.

## 2016-12-02 NOTE — Telephone Encounter (Signed)
[employer] AuerbachC[employer] Corporation called to speak to someone who deals with prior authorizations for the patient. I transferred the call to KendallKeith.

## 2016-12-02 NOTE — Telephone Encounter (Signed)
Noted  

## 2016-12-02 NOTE — Telephone Encounter (Signed)
Called and spoke with pt and he is aware of his results.  These were called to him by his PCP office.

## 2016-12-21 ENCOUNTER — Telehealth: Payer: Self-pay | Admitting: Physician Assistant

## 2016-12-21 NOTE — Telephone Encounter (Signed)
[employer] called in reference to patient needing cpap machine. Cpap machine needs to be ordered from provider. Please advise.

## 2016-12-21 NOTE — Telephone Encounter (Signed)
Mellody DanceKeith, do you know anything about this?

## 2017-02-02 ENCOUNTER — Telehealth: Payer: Self-pay

## 2017-02-04 NOTE — Telephone Encounter (Signed)
Error

## 2017-10-05 ENCOUNTER — Encounter: Payer: Self-pay | Admitting: Physician Assistant

## 2017-10-05 ENCOUNTER — Ambulatory Visit (INDEPENDENT_AMBULATORY_CARE_PROVIDER_SITE_OTHER): Payer: Managed Care, Other (non HMO) | Admitting: Physician Assistant

## 2017-10-05 DIAGNOSIS — G4733 Obstructive sleep apnea (adult) (pediatric): Secondary | ICD-10-CM

## 2017-10-05 NOTE — Patient Instructions (Addendum)
It was great to see you!  I will fax over the new order for [employer] at this time.  Let's follow-up in 3 months for a physical, sooner if you have concerns.  Take care,  Jarold MottoSamantha Dodger Sinning PA-C

## 2017-10-05 NOTE — Progress Notes (Signed)
Alexander [name] is a 27 y.o. male here for a follow up of a pre-existing problem.   History of Present Illness:   Chief Complaint  Patient presents with  . Sleep Apnea    HPI   Sleep apnea -- patient underwent sleep study on 11/05/16 that confirmed sleep apnea. He was then prescribed a CPAP from [employer] in October 2018. He states that he did not use it as much as he was supposed to, so the machine was given to the company. He is here to let us know that he is needing a new prescription for [employer] as he has made some changes in his life and he is hoping to be more compliant with this and start back on his CPAP.   Morbid obesity -- his weight continues to improve. He states that his girlfriend is very "into fitness" and is a better influence. He is getting back into the gym and making better food choices.  Wt Readings from Last 3 Encounters:  10/05/17 (!) 392 lb 9.6 oz (178.1 kg)  09/17/16 (!) 417 lb (189.1 kg)  03/29/15 (!) 415 lb (188.2 kg)      Past Medical History:  Diagnosis Date  . Anxiety   . Obstructive sleep apnea    non compliant see [facility]a      Social History   Socioeconomic History  . Marital status: Single    Spouse name: Not on file  . Number of children: Not on file  . Years of education: Not on file  . Highest education level: Not on file  Occupational History  . Not on file  Social Needs  . Financial resource strain: Not on file  . Food insecurity:    Worry: Not on file    Inability: Not on file  . Transportation needs:    Medical: Not on file    Non-medical: Not on file  Tobacco Use  . Smoking status: Former Smoker    Packs/day: 1.00    Years: 10.00    Pack years: 10.00    Types: Cigarettes    Last attempt to quit: 07/17/2016    Years since quitting: 1.2  . Smokeless tobacco: Never Used  . Tobacco comment: Hookah every other day  Substance and Sexual Activity  . Alcohol use: Yes    Alcohol/week: 3.0 - 4.0 standard drinks    Types: 3 - 4  Cans of beer per week  . Drug use: No  . Sexual activity: Yes  Lifestyle  . Physical activity:    Days per week: Not on file    Minutes per session: Not on file  . Stress: Not on file  Relationships  . Social connections:    Talks on phone: Not on file    Gets together: Not on file    Attends religious service: Not on file    Active member of club or organization: Not on file    Attends meetings of clubs or organizations: Not on file    Relationship status: Not on file  . Intimate partner violence:    Fear of current or ex partner: Not on file    Emotionally abused: Not on file    Physically abused: Not on file    Forced sexual activity: Not on file  Other Topics Concern  . Not on file  Social History Narrative   [employer]   Daughter, 4 y/o -- sees 3-4 times a week    History reviewed. No pertinent surgical history.  Family History  Problem Relation Age of Onset  . Heart disease Maternal Grandfather   . Hypertension Maternal Grandfather     No Known Allergies  Current Medications:  No current outpatient medications on file.   Review of Systems:   ROS  Vitals:   Vitals:   10/05/17 0852  BP: 138/88  Pulse: 85  Temp: 98.3 F (36.8 C)  TempSrc: Oral  SpO2: 95%  Weight: (!) 392 lb 9.6 oz (178.1 kg)  Height: 6\' 4"  (1.93 m)     Body mass index is 47.79 kg/m.  Physical Exam:   Physical Exam  Constitutional: He appears well-developed. He is cooperative.  Non-toxic appearance. He does not have a sickly appearance. He does not appear ill. No distress.  Cardiovascular: Normal rate, regular rhythm, S1 normal, S2 normal, normal heart sounds and normal pulses.  No LE edema  Pulmonary/Chest: Effort normal and breath sounds normal.  Neurological: He is alert. GCS eye subscore is 4. GCS verbal subscore is 5. GCS motor subscore is 6.  Skin: Skin is warm, dry and intact.  Psychiatric: He has a normal mood and affect. His speech is normal and behavior  is normal.  Nursing note and vitals reviewed.   Assessment and Plan:    [name] was seen today for sleep apnea.  Diagnoses and all orders for this visit:  Morbid obesity (HCC)  OSA (obstructive sleep apnea)   Will fax over order for Auto CPAP via [employer] today.  Continue weight loss efforts.  Follow-up per patient's convenience for CPE.  Marland Kitchen. Reviewed expectations re: course of current medical issues. . Discussed self-management of symptoms. . Outlined signs and symptoms indicating need for more acute intervention. . Patient verbalized understanding and all questions were answered. . See orders for this visit as documented in the electronic medical record. . Patient received an After-Visit Summary.   Jarold MottoSamantha Erline Siddoway, PA-C

## 2017-10-27 ENCOUNTER — Telehealth: Payer: Self-pay | Admitting: Physician Assistant

## 2017-10-27 NOTE — Telephone Encounter (Signed)
This order was sent to [facility] on [id number]. LVM for patient to call [employer] at [phone].  Copied from CRM. l #[id number]. Topic: Referral - Status >> Oct 27, 2017  4:02 PM Baldo Daub L wrote: Reason for CRM:   Pt calling to check on the status of his sleep study.  Pt can be reached at [phone].

## 2017-12-06 ENCOUNTER — Encounter: Payer: Self-pay | Admitting: Physician Assistant

## 2017-12-06 ENCOUNTER — Ambulatory Visit (INDEPENDENT_AMBULATORY_CARE_PROVIDER_SITE_OTHER): Payer: Managed Care, Other (non HMO) | Admitting: Physician Assistant

## 2017-12-06 VITALS — BP 150/90 | HR 71 | Temp 97.9°F | Ht 76.0 in | Wt >= 6400 oz

## 2017-12-06 DIAGNOSIS — N529 Male erectile dysfunction, unspecified: Secondary | ICD-10-CM | POA: Diagnosis not present

## 2017-12-06 DIAGNOSIS — Z114 Encounter for screening for human immunodeficiency virus [HIV]: Secondary | ICD-10-CM

## 2017-12-06 DIAGNOSIS — Z136 Encounter for screening for cardiovascular disorders: Secondary | ICD-10-CM | POA: Diagnosis not present

## 2017-12-06 DIAGNOSIS — G4733 Obstructive sleep apnea (adult) (pediatric): Secondary | ICD-10-CM

## 2017-12-06 DIAGNOSIS — Z0001 Encounter for general adult medical examination with abnormal findings: Secondary | ICD-10-CM | POA: Diagnosis not present

## 2017-12-06 DIAGNOSIS — L659 Nonscarring hair loss, unspecified: Secondary | ICD-10-CM

## 2017-12-06 DIAGNOSIS — Z13220 Encounter for screening for lipoid disorders: Secondary | ICD-10-CM

## 2017-12-06 LAB — CBC WITH DIFFERENTIAL/PLATELET
BASOS ABS: 0 10*3/uL (ref 0.0–0.1)
BASOS PCT: 0.2 % (ref 0.0–3.0)
EOS PCT: 2.5 % (ref 0.0–5.0)
Eosinophils Absolute: 0.1 10*3/uL (ref 0.0–0.7)
HEMATOCRIT: 44.4 % (ref 39.0–52.0)
Hemoglobin: 14.8 g/dL (ref 13.0–17.0)
LYMPHS PCT: 40.1 % (ref 12.0–46.0)
Lymphs Abs: 1.6 10*3/uL (ref 0.7–4.0)
MCHC: 33.4 g/dL (ref 30.0–36.0)
MCV: 80.1 fl (ref 78.0–100.0)
MONOS PCT: 8.5 % (ref 3.0–12.0)
Monocytes Absolute: 0.3 10*3/uL (ref 0.1–1.0)
Neutro Abs: 1.9 10*3/uL (ref 1.4–7.7)
Neutrophils Relative %: 48.7 % (ref 43.0–77.0)
PLATELETS: 206 10*3/uL (ref 150.0–400.0)
RBC: 5.54 Mil/uL (ref 4.22–5.81)
RDW: 14 % (ref 11.5–15.5)
WBC: 4 10*3/uL (ref 4.0–10.5)

## 2017-12-06 LAB — COMPREHENSIVE METABOLIC PANEL
ALT: 20 U/L (ref 0–53)
AST: 15 U/L (ref 0–37)
Albumin: 4 g/dL (ref 3.5–5.2)
Alkaline Phosphatase: 55 U/L (ref 39–117)
BUN: 14 mg/dL (ref 6–23)
CALCIUM: 9.2 mg/dL (ref 8.4–10.5)
CHLORIDE: 103 meq/L (ref 96–112)
CO2: 28 meq/L (ref 19–32)
Creatinine, Ser: 0.88 mg/dL (ref 0.40–1.50)
GFR: 131.6 mL/min (ref >60.00)
GLUCOSE: 94 mg/dL (ref 70–99)
POTASSIUM: 4.5 meq/L (ref 3.5–5.1)
Sodium: 138 mEq/L (ref 135–145)
TOTAL PROTEIN: 6.8 g/dL (ref 6.0–8.3)
Total Bilirubin: 1.2 mg/dL (ref 0.2–1.2)

## 2017-12-06 LAB — LIPID PANEL
CHOL/HDL RATIO: 3
Cholesterol: 172 mg/dL (ref 0–200)
HDL: 62 mg/dL (ref >39.00)
LDL CALC: 102 mg/dL — AB (ref 0–99)
NONHDL: 110.41
Triglycerides: 41 mg/dL (ref 0.0–149.0)
VLDL: 8.2 mg/dL (ref 0.0–40.0)

## 2017-12-06 LAB — TSH: TSH: 1.45 u[IU]/mL (ref 0.35–4.50)

## 2017-12-06 LAB — HEMOGLOBIN A1C: Hgb A1c MFr Bld: 5.5 % (ref 4.6–6.5)

## 2017-12-06 LAB — TESTOSTERONE: Testosterone: 247.28 ng/dL — ABNORMAL LOW (ref 300.00–890.00)

## 2017-12-06 NOTE — Progress Notes (Signed)
I acted as a Neurosurgeon for [employer], PA-C Corky Mull, LPN  Subjective:    Alexander [name] is a 27 y.o. male and is here for a comprehensive physical exam.  HPI  Health Maintenance Due  Topic Date Due  . HIV Screening  11/08/2003    Acute Concerns: Hair loss -- over the past few months he has noticed a small area of hair loss to his L upper scalp. Denies overall hair thinning and denies new hair care products. He states that his barber said that it may be related to stress but to keep an eye on it. Testosterone levels -- has had some issues with maintain erection in the past. Has never tried Viagra, wants his Testosterone level checked if possible today.  Chronic Issues: OSA -- using the CPAP, isn't always compliant because he falls asleep before he remembers to put it on. Does feel better when he uses it.  BP Readings from Last 3 Encounters:  12/06/17 (!) 150/90  10/05/17 138/88  09/17/16 130/90    Health Maintenance: Immunizations -- Pt declined Flu and Tetanus Colonoscopy -- N/A PSA -- N/A Diet -- sometimes at work does not eat anything, working on diet Caffeine intake -- minimal intake Sleep habits -- sleeping well when using mask Exercise -- just renewed his gym membership Weight -- Weight: (!) 401 lb 4 oz (182 kg)  Weight history Wt Readings from Last 10 Encounters:  12/06/17 (!) 401 lb 4 oz (182 kg)  10/05/17 (!) 392 lb 9.6 oz (178.1 kg)  09/17/16 (!) 417 lb (189.1 kg)  03/29/15 (!) 415 lb (188.2 kg)  03/25/15 (!) 418 lb 9 oz (189.9 kg)  Mood -- overall good, stressed with daughter's upcoming  Tobacco use -- quit this year Alcohol use --- none excessive  Depression screen PHQ 2/9 12/06/2017  Decreased Interest 0  Down, Depressed, Hopeless 0  PHQ - 2 Score 0   Other providers/specialists: Not utd with dentist. Does not see eye doctor.   PMHx, SurgHx, SocialHx, Medications, and Allergies were reviewed in the Visit Navigator and updated as  appropriate.   Past Medical History:  Diagnosis Date  . Anxiety   . Obstructive sleep apnea    non compliant see mida     History reviewed. No pertinent surgical history.   Family History  Problem Relation Age of Onset  . Heart disease Maternal Grandfather   . Hypertension Maternal Grandfather     Social History   Tobacco Use  . Smoking status: Former Smoker    Packs/day: 1.00    Years: 10.00    Pack years: 10.00    Types: Cigarettes    Last attempt to quit: 11/16/2017    Years since quitting: 0.0  . Smokeless tobacco: Never Used  Substance Use Topics  . Alcohol use: Yes    Alcohol/week: 3.0 - 4.0 standard drinks    Types: 3 - 4 Cans of beer per week  . Drug use: No    Review of Systems:   ROS  Negative unless otherwise specified per HPI.  Objective:   Vitals:   12/06/17 0743 12/06/17 0820  BP: (!) 140/94 (!) 150/90  Pulse: 71   Temp: 97.9 F (36.6 C)   SpO2: 95%    Body mass index is 48.84 kg/m.  General Appearance:  Alert, cooperative, no distress, appears stated age  Head:  Normocephalic, without obvious abnormality, atraumatic  Eyes:  PERRL, conjunctiva/corneas clear, EOM's intact, fundi benign, both eyes  Ears:  Normal TM's and external ear canals, both ears  Nose: Nares normal, septum midline, mucosa normal, no drainage    or sinus tenderness  Throat: Lips, mucosa, and tongue normal; teeth and gums normal  Neck: Supple, symmetrical, trachea midline, no adenopathy; thyroid:  No enlargement/tenderness/nodules; no carotit bruit or JVD  Back:   Symmetric, no curvature, ROM normal, no CVA tenderness  Lungs:   Clear to auscultation bilaterally, respirations unlabored  Chest wall:  No tenderness or deformity  Heart:  Regular rate and rhythm, S1 and S2 normal, no murmur, rub   or gallop  Abdomen:   Soft, non-tender, bowel sounds active all four quadrants, no masses, no organomegaly  Extremities: Extremities normal, atraumatic, no cyanosis or edema    Prostate: Not done.   Skin: Skin color, texture, turgor normal, no rashes or lesions  Dime-sized area near hairline at L temple with absence of hair -- no tenderness or skin discoloration noted  Lymph nodes: Cervical, supraclavicular, and axillary nodes normal  Neurologic: CNII-XII grossly intact. Normal strength, sensation and reflexes throughout    Assessment/Plan:   Alexander [name] was seen today for annual exam.  Diagnoses and all orders for this visit:  Encounter for general adult medical examination with abnormal findings Today patient counseled on age appropriate routine health concerns for screening and prevention, each reviewed and up to date or declined. Immunizations reviewed and up to date or declined. Labs ordered and reviewed. Risk factors for depression reviewed and negative. Hearing function and visual acuity are intact. ADLs screened and addressed as needed. Functional ability and level of safety reviewed and appropriate. Education, counseling and referrals performed based on assessed risks today. Patient provided with a copy of personalized plan for preventive services.  Morbid obesity (HCC) He is very motivated right now. Girlfriend is moving in with him and she works out regularly. He declines need for medication at this time, but we did discuss that if HgbA1c is elevated we may consider Metformin. Follow-up in 3 months. -     CBC with Differential/Platelet -     Comprehensive metabolic panel -     TSH -     Testosterone -     Hemoglobin A1c  OSA (obstructive sleep apnea) Continue compliance with CPAP.  Encounter for lipid screening for cardiovascular disease -     Lipid panel  Erectile dysfunction, unspecified erectile dysfunction type Declines need for medication at this time but would like testosterone checked. I discussed that if this is low, we we would refer to urology. -     Testosterone  Patchy loss of hair Monitor. Possibly stress-related. Follow-up if  symptoms worsen or persist.  Screening for HIV (human immunodeficiency virus) -     HIV Antibody (routine testing w rflx)  Well Adult Exam: Labs ordered: Yes. Patient counseling was done. See below for items discussed. Discussed the patient's BMI.  The BMI BMI is not in the acceptable range; BMI management plan is completed Follow up in 3 months.  Patient Counseling: [x]   Nutrition: Stressed importance of moderation in sodium/caffeine intake, saturated fat and cholesterol, caloric balance, sufficient intake of fresh fruits, vegetables, and fiber.  [x]   Stressed the importance of regular exercise.   []   Substance Abuse: Discussed cessation/primary prevention of tobacco, alcohol, or other drug use; driving or other dangerous activities under the influence; availability of treatment for abuse.   [x]   Injury prevention: Discussed safety belts, safety helmets, smoke detector, smoking near bedding or upholstery.   []   Sexuality: Discussed sexually transmitted diseases, partner selection, use of condoms, avoidance of unintended pregnancy  and contraceptive alternatives.   [x]   Dental health: Discussed importance of regular tooth brushing, flossing, and dental visits.  [x]   Health maintenance and immunizations reviewed. Please refer to Health maintenance section.    CMA or LPN served as scribe during this visit. History, Physical, and Plan performed by medical provider. The above documentation has been reviewed and is accurate and complete.  Alexander Motto, PA-C [facility]

## 2017-12-06 NOTE — Patient Instructions (Signed)
It was great to see you!  Please go to the lab for blood work.   Our office will call you with your results unless you have chosen to receive results via MyChart.  If your blood work is normal we will follow-up each year for physicals and as scheduled for chronic medical problems.  If anything is abnormal we will treat accordingly and get you in for a follow-up.  Take care,  Alexander [name]  Let's follow-up in 3 month to see how you are doing with your weight loss!   Health Maintenance, Male A healthy lifestyle and preventive care is important for your health and wellness. Ask your health care provider about what schedule of regular examinations is right for you. What should I know about weight and diet? Eat a Healthy Diet  Eat plenty of vegetables, fruits, whole grains, low-fat dairy products, and lean protein.  Do not eat a lot of foods high in solid fats, added sugars, or salt.  Maintain a Healthy Weight Regular exercise can help you achieve or maintain a healthy weight. You should:  Do at least 150 minutes of exercise each week. The exercise should increase your heart rate and make you sweat (moderate-intensity exercise).  Do strength-training exercises at least twice a week.  Watch Your Levels of Cholesterol and Blood Lipids  Have your blood tested for lipids and cholesterol every 5 years starting at 29 years of age. If you are at high risk for heart disease, you should start having your blood tested when you are 29 years old. You may need to have your cholesterol levels checked more often if: ? Your lipid or cholesterol levels are high. ? You are older than 29 years of age. ? You are at high risk for heart disease.  What should I know about cancer screening? Many types of cancers can be detected early and may often be prevented. Lung Cancer  You should be screened every year for lung cancer if: ? You are a current smoker who has smoked for at least 30 years. ? You are a  former smoker who has quit within the past 15 years.  Talk to your health care provider about your screening options, when you should start screening, and how often you should be screened.  Colorectal Cancer  Routine colorectal cancer screening usually begins at 29 years of age and should be repeated every 5-10 years until you are 28 years old. You may need to be screened more often if early forms of precancerous polyps or small growths are found. Your health care provider may recommend screening at an earlier age if you have risk factors for colon cancer.  Your health care provider may recommend using home test kits to check for hidden blood in the stool.  A small camera at the end of a tube can be used to examine your colon (sigmoidoscopy or colonoscopy). This checks for the earliest forms of colorectal cancer.  Prostate and Testicular Cancer  Depending on your age and overall health, your health care provider may do certain tests to screen for prostate and testicular cancer.  Talk to your health care provider about any symptoms or concerns you have about testicular or prostate cancer.  Skin Cancer  Check your skin from head to toe regularly.  Tell your health care provider about any new moles or changes in moles, especially if: ? There is a change in a mole's size, shape, or color. ? You have a mole that is larger than  a pencil eraser.  Always use sunscreen. Apply sunscreen liberally and repeat throughout the day.  Protect yourself by wearing long sleeves, pants, a wide-brimmed hat, and sunglasses when outside.  What should I know about heart disease, diabetes, and high blood pressure?  If you are 72-43 years of age, have your blood pressure checked every 3-5 years. If you are 20 years of age or older, have your blood pressure checked every year. You should have your blood pressure measured twice-once when you are at a hospital or clinic, and once when you are not at a hospital or  clinic. Record the average of the two measurements. To check your blood pressure when you are not at a hospital or clinic, you can use: ? An automated blood pressure machine at a pharmacy. ? A home blood pressure monitor.  Talk to your health care provider about your target blood pressure.  If you are between 67-62 years old, ask your health care provider if you should take aspirin to prevent heart disease.  Have regular diabetes screenings by checking your fasting blood sugar level. ? If you are at a normal weight and have a low risk for diabetes, have this test once every three years after the age of 44. ? If you are overweight and have a high risk for diabetes, consider being tested at a younger age or more often.  A one-time screening for abdominal aortic aneurysm (AAA) by ultrasound is recommended for men aged 60-75 years who are current or former smokers. What should I know about preventing infection? Hepatitis B If you have a higher risk for hepatitis B, you should be screened for this virus. Talk with your health care provider to find out if you are at risk for hepatitis B infection. Hepatitis C Blood testing is recommended for:  Everyone born from [birth year] through 1967.  Anyone with known risk factors for hepatitis C.  Sexually Transmitted Diseases (STDs)  You should be screened each year for STDs including gonorrhea and chlamydia if: ? You are sexually active and are younger than 29 years of age. ? You are older than 29 years of age and your health care provider tells you that you are at risk for this type of infection. ? Your sexual activity has changed since you were last screened and you are at an increased risk for chlamydia or gonorrhea. Ask your health care provider if you are at risk.  Talk with your health care provider about whether you are at high risk of being infected with HIV. Your health care provider may recommend a prescription medicine to help prevent HIV  infection.  What else can I do?  Schedule regular health, dental, and eye exams.  Stay current with your vaccines (immunizations).  Do not use any tobacco products, such as cigarettes, chewing tobacco, and e-cigarettes. If you need help quitting, ask your health care provider.  Limit alcohol intake to no more than 2 drinks per day. One drink equals 12 ounces of beer, 5 ounces of wine, or 1 ounces of hard liquor.  Do not use street drugs.  Do not share needles.  Ask your health care provider for help if you need support or information about quitting drugs.  Tell your health care provider if you often feel depressed.  Tell your health care provider if you have ever been abused or do not feel safe at home. This information is not intended to replace advice given to you by your health care provider. Make  sure you discuss any questions you have with your health care provider. Document Released: 08/01/2007 Document Revised: 10/02/2015 Document Reviewed: 11/06/2014 Elsevier Interactive Patient Education  [employer].

## 2017-12-07 LAB — HIV ANTIBODY (ROUTINE TESTING W REFLEX): HIV: NONREACTIVE

## 2017-12-08 ENCOUNTER — Other Ambulatory Visit: Payer: Self-pay

## 2017-12-08 DIAGNOSIS — N529 Male erectile dysfunction, unspecified: Secondary | ICD-10-CM

## 2018-03-07 ENCOUNTER — Ambulatory Visit: Payer: Managed Care, Other (non HMO) | Admitting: Physician Assistant

## 2018-03-07 ENCOUNTER — Ambulatory Visit (INDEPENDENT_AMBULATORY_CARE_PROVIDER_SITE_OTHER): Payer: Managed Care, Other (non HMO) | Admitting: Physician Assistant

## 2018-03-07 ENCOUNTER — Encounter: Payer: Self-pay | Admitting: Physician Assistant

## 2018-03-07 DIAGNOSIS — M79661 Pain in right lower leg: Secondary | ICD-10-CM | POA: Diagnosis not present

## 2018-03-07 NOTE — Progress Notes (Signed)
Alexander [name] is a 28 y.o. male is here to discuss: Weight loss.  I acted as a Neurosurgeon for [employer], PA-C Corky Mull, LPN  History of Present Illness:   Chief Complaint  Patient presents with  . Weight Check    HPI  Weight check Pt here for follow up on weight loss. Pt has lost 20 lbs since here last on 10/21. Pt watching diet and exercising daily past 2 weeks. Has increased exercise since holidays have been over -- he is doing mostly cardio. Pt is trying to stay away from fast food and meal prepping. His girlfriend is very motivating to help him lose weight.   Wt Readings from Last 5 Encounters:  03/07/18 (!) 380 lb (172.4 kg)  12/06/17 (!) 401 lb 4 oz (182 kg)  10/05/17 (!) 392 lb 9.6 oz (178.1 kg)  09/17/16 (!) 417 lb (189.1 kg)  03/29/15 (!) 415 lb (188.2 kg)   Would like to reach 300 lb by Sep [birth year] (his 28th birthday.)  He also briefly describes some intermittent RLE numbness. Had an injury playing football in his backyard >10 years ago. Has history of lumbar pain and strain as well. Wondering if he should go to a Land. Denies: saddle anesthesia, loss of bowel/bladder function  There are no preventive care reminders to display for this patient.  Past Medical History:  Diagnosis Date  . Anxiety   . Obstructive sleep apnea    non compliant see mida      Social History   Socioeconomic History  . Marital status: Single    Spouse name: Not on file  . Number of children: Not on file  . Years of education: Not on file  . Highest education level: Not on file  Occupational History  . Not on file  Social Needs  . Financial resource strain: Not on file  . Food insecurity:    Worry: Not on file    Inability: Not on file  . Transportation needs:    Medical: Not on file    Non-medical: Not on file  Tobacco Use  . Smoking status: Former Smoker    Packs/day: 1.00    Years: 10.00    Pack years: 10.00    Types: Cigarettes    Last attempt to  quit: 11/16/2017    Years since quitting: 0.3  . Smokeless tobacco: Never Used  Substance and Sexual Activity  . Alcohol use: Yes    Alcohol/week: 3.0 - 4.0 standard drinks    Types: 3 - 4 Cans of beer per week  . Drug use: No  . Sexual activity: Yes  Lifestyle  . Physical activity:    Days per week: Not on file    Minutes per session: Not on file  . Stress: Not on file  Relationships  . Social connections:    Talks on phone: Not on file    Gets together: Not on file    Attends religious service: Not on file    Active member of club or organization: Not on file    Attends meetings of clubs or organizations: Not on file    Relationship status: Not on file  . Intimate partner violence:    Fear of current or ex partner: Not on file    Emotionally abused: Not on file    Physically abused: Not on file    Forced sexual activity: Not on file  Other Topics Concern  . Not on file  Social History Narrative  [employer]   Daughter, 4 y/o -- sees 3-4 times a week    History reviewed. No pertinent surgical history.  Family History  Problem Relation Age of Onset  . Heart disease Maternal Grandfather   . Hypertension Maternal Grandfather     PMHx, SurgHx, SocialHx, FamHx, Medications, and Allergies were reviewed in the Visit Navigator and updated as appropriate.   Patient Active Problem List   Diagnosis Date Noted  . Anxiety 09/17/2016  . Morbid obesity (HCC) 09/17/2016    Social History   Tobacco Use  . Smoking status: Former Smoker    Packs/day: 1.00    Years: 10.00    Pack years: 10.00    Types: Cigarettes    Last attempt to quit: 11/16/2017    Years since quitting: 0.3  . Smokeless tobacco: Never Used  Substance Use Topics  . Alcohol use: Yes    Alcohol/week: 3.0 - 4.0 standard drinks    Types: 3 - 4 Cans of beer per week  . Drug use: No    Current Medications and Allergies:    Current Outpatient Medications:  .  clobetasol (TEMOVATE) 0.05 %  external solution, , Disp: , Rfl:  .  ibuprofen (ADVIL,MOTRIN) 200 MG tablet, Take 200 mg by mouth as needed., Disp: , Rfl:   No Known Allergies  Review of Systems   ROS  Negative unless otherwise specified per HPI.   Vitals:   Vitals:   03/07/18 0816  BP: 134/80  Pulse: 71  Temp: 98.4 F (36.9 C)  TempSrc: Oral  SpO2: 95%  Weight: (!) 380 lb (172.4 kg)  Height: 6\' 4"  (1.93 m)     Body mass index is 46.26 kg/m.   Physical Exam:    Physical Exam Vitals signs and nursing note reviewed.  Constitutional:      General: He is not in acute distress.    Appearance: He is well-developed. He is not ill-appearing or toxic-appearing.  Cardiovascular:     Rate and Rhythm: Normal rate and regular rhythm.     Pulses: Normal pulses.     Heart sounds: Normal heart sounds, S1 normal and S2 normal.     Comments: No LE edema Pulmonary:     Effort: Pulmonary effort is normal.     Breath sounds: Normal breath sounds.  Skin:    General: Skin is warm and dry.  Neurological:     Mental Status: He is alert.     GCS: GCS eye subscore is 4. GCS verbal subscore is 5. GCS motor subscore is 6.  Psychiatric:        Speech: Speech normal.        Behavior: Behavior normal. Behavior is cooperative.      Assessment and Plan:    Alexander [name] was seen today for weight check.  Diagnoses and all orders for this visit:  Morbid obesity (HCC) Doing well. He would like to defer on medications until he hits a plateau. I think this is completely reasonable. Continue diet and exercise. Follow-up with [place] when needed.  Pain in right lower leg Will refer to Dr. Berline Chough for further evaluation of this and chronic lower back pain. He knows that with continued weight loss, he should expect improvement in his back. -     Ambulatory referral to Sports Medicine   . Reviewed expectations re: course of current medical issues. . Discussed self-management of symptoms. . Outlined signs and symptoms indicating need  for more acute intervention. . Patient verbalized understanding  and all questions were answered. . See orders for this visit as documented in the electronic medical record. . Patient received an After Visit Summary.  CMA or LPN served as scribe during this visit. History, Physical, and Plan performed by medical provider. The above documentation has been reviewed and is accurate and complete.   Jarold MottoSamantha Ajene Carchi, PA-C [place] 03/07/2018  Follow-up: No follow-ups on file.

## 2018-03-07 NOTE — Patient Instructions (Signed)
It was great to see you!  Keep up the work on your weight loss!  If you hit a plateau, come back and see [place] and we can discuss potentially starting medications.  I think it would be a great idea for you to see Dr. Berline Chough, our sports medicine doctor, for further evaluation of your lower leg issues and back pain. You can make an appointment with him at your convenience.  Take care,  Jarold Motto PA-C

## 2018-04-12 ENCOUNTER — Encounter: Payer: Self-pay | Admitting: Sports Medicine

## 2018-04-12 ENCOUNTER — Ambulatory Visit (INDEPENDENT_AMBULATORY_CARE_PROVIDER_SITE_OTHER): Payer: Managed Care, Other (non HMO)

## 2018-04-12 ENCOUNTER — Ambulatory Visit (INDEPENDENT_AMBULATORY_CARE_PROVIDER_SITE_OTHER): Payer: Managed Care, Other (non HMO) | Admitting: Sports Medicine

## 2018-04-12 VITALS — BP 128/78 | HR 96 | Ht 76.0 in | Wt 387.0 lb

## 2018-04-12 DIAGNOSIS — M79661 Pain in right lower leg: Secondary | ICD-10-CM | POA: Diagnosis not present

## 2018-04-12 DIAGNOSIS — G8929 Other chronic pain: Secondary | ICD-10-CM | POA: Diagnosis not present

## 2018-04-12 DIAGNOSIS — M545 Low back pain, unspecified: Secondary | ICD-10-CM

## 2018-04-12 DIAGNOSIS — M9902 Segmental and somatic dysfunction of thoracic region: Secondary | ICD-10-CM

## 2018-04-12 DIAGNOSIS — M9905 Segmental and somatic dysfunction of pelvic region: Secondary | ICD-10-CM | POA: Diagnosis not present

## 2018-04-12 DIAGNOSIS — M9903 Segmental and somatic dysfunction of lumbar region: Secondary | ICD-10-CM

## 2018-04-12 NOTE — Progress Notes (Signed)
Alexander [name]. [name] [id number]  Alexander [name] - 28 y.o. male MRN [id number]  Date of birth: [birth year]  Visit Date: 04/12/2018  PCP: Andrena Mews, DO   Referred by: Jarold Motto, [place]  SUBJECTIVE:   Chief Complaint  Patient presents with  . Initial Assessment    Referred by Jarold Motto, PA.     HPI: Patient is here for initial evaluation of chronic low back pain that is been present for several years.  He is also having some occasional right leg pain that is intermittent in nature and he has a hard time describing what sounds to be more of a numbness.  It does not seem to correlate with his back pain or with any specific activities.  This does seem to come and go.  He is recently lost 60 pounds intentionally trying to increase his activities.  He otherwise is having just generalized stiffness in his back.  Ibuprofen and Flexeril has been mildly helpful.  He is fairly active and performs lifting bending twisting activities at work and these do seem to slightly exacerbate his symptoms in his back.    REVIEW OF SYSTEMS: No significant nighttime awakenings due to this issue. Denies fevers, chills, recent weight gain or weight loss.  No night sweats.  Pt denies any change in bowel or bladder habits, muscle weakness, numbness or falls associated with this pain. Otherwise 12 point review of systems performed and is negative   HISTORY:  Prior history reviewed and updated per electronic medical record.  Patient Active Problem List   Diagnosis Date Noted  . Anxiety 09/17/2016  . Morbid obesity (HCC) 09/17/2016   Social History   Occupational History  . Not on file  Tobacco Use  . Smoking status: Former Smoker    Packs/day: 1.00    Years: 10.00    Pack years: 10.00    Types: Cigarettes    Last attempt to quit: 11/16/2017    Years since quitting: 0.4  . Smokeless tobacco: Never Used  Substance  and Sexual Activity  . Alcohol use: Yes    Alcohol/week: 3.0 - 4.0 standard drinks    Types: 3 - 4 Cans of beer per week  . Drug use: No  . Sexual activity: Yes   Social History   Social History Narrative   Designer, [employer]   Daughter, 4 y/o -- sees 3-4 times a week   Past Medical History:  Diagnosis Date  . Anxiety   . Obstructive sleep apnea    non compliant see mida    History reviewed. No pertinent surgical history. family history includes Heart disease in his maternal grandfather; Hypertension in his maternal grandfather.  OBJECTIVE:  VS:  HT:6\' 4"  (193 cm)   WT:(!) 387 lb (175.5 kg)  BMI:47.13    BP:128/78  HR:96bpm  TEMP: ( )  RESP:94 %   PHYSICAL EXAM: CONSTITUTIONAL: Well-developed, Well-nourished and In no acute distress EYES: Pupils are equal., EOM intact without nystagmus. and No scleral icterus. Psychiatric: Alert & appropriately interactive. and Not depressed or anxious appearing. EXTREMITY EXAM: Warm and well perfused  His back is overall well aligned without significant scoliosis.  He has good forward flexion.  He has markedly tight hip flexors right worse than left.  Negative straight leg raise bilaterally.  He does have a slight dysesthesia in the L5 distribution.  Lower extremity reflexes are symmetric and normal.  Lower extremity strength is  5/5.  He is able to heel toe walk without difficulty.  No significant lower extremity edema.   ASSESSMENT:   1. Pain in right lower leg   2. Chronic midline low back pain, unspecified whether sciatica present   3. Somatic dysfunction of lumbar region   4. Somatic dysfunction of pelvis region   5. Somatic dysfunction of thoracic region     PROCEDURES:  PROCEDURE NOTE : OSTEOPATHIC MANIPULATION The decision today to treat with Osteopathic Manipulative Therapy (OMT) was based on physical exam findings. Verbal consent was obtained following a discussion with the patient regarding the of risks, benefits  and potential side effects, including an acute pain flare,post manipulation soreness and need for repeat treatments.     Contraindications to OMT: NONE  Manipulation was performed as below: Regions Treated & Osteopathic Exam Findings  THORACIC SPINE:  T5 ERS LEFT (Extended, Rotated & Sidebent) LUMBAR SPINE:  L2 FRS LEFT (Flexed, Rotated & Sidebent) L4 FRS LEFT (Flexed, Rotated & Sidebent) PELVIS:  Right psoas spasm Right anterior innonimate   OMT Techniques Used  HVLA muscle energy myofascial release HVLA - Long Lever    The patient tolerated the treatment well and reported Improved symptoms following treatment today. Patient was given medications, exercises, stretches and lifestyle modifications per AVS and verbally.    PLAN:  Pertinent additional documentation may be included in corresponding procedure notes, imaging studies, problem based documentation and patient instructions.  Seems to be functional in nature.  Low suspicion for true radicular component given the negative neural tension signs.  If any lack of improvement further diagnostic evaluation with MRI could be considered.  Links to [employer] provided today per Patient Instructions.  These exercises were developed by Myles Lipps, DC with a strong emphasis on core neuromuscular reducation and postural realignment through body-weight exercises.  Home Therapeutic exercises prescribed today per AVS  Osteopathic manipulation was performed today based on physical exam findings.  Patient was counseled on the purpose and expected outcome of osteopathic manipulation and understands that a single treatment may not provide permanent long lasting relief.  They understand that home therapeutic exercises are critical part of the healing/treatment process and will continue with self treatment between now and their next visit as outlined.  The patient understands that the frequency of visits is meant to provide a  stimulus to promote the body's own ability to heal and is not meant to be the sole means for improvement in their symptoms.  Activity modifications and the importance of avoiding exacerbating activities (limiting pain to no more than a 4 / 10 during or following activity) recommended and discussed.  Discussed red flag symptoms that warrant earlier emergent evaluation and patient voices understanding.   No orders of the defined types were placed in this encounter.  Lab Orders  No laboratory test(s) ordered today   Imaging Orders     DG Lumbar Spine Complete Referral Orders  No referral(s) requested today    Return in about 2 weeks (around 04/26/2018).          Andrena Mews, DO    [facility]

## 2018-04-12 NOTE — Patient Instructions (Addendum)
Also check out [employer]" which is a program developed by Dr. Myles Lipps.   There are links to a couple of his YouTube Videos below and I would like to see you performing one of his videos 5-6 days per week.  It is best to do these exercises first thing in the morning.  They will give you a good jumpstart here today and start normalizing the way you move.  A good intro video is: "Independence from Pain 7-minute Video" - [email or web address]   A more advanced video is: Scientist, research (medical) original 12 minutes" - [email or web address]  Exercises that focus more on the neck are as below: Dr. Derrill Kay with [employer] [facility] teaching neck and shoulder details Part 1 - https://youtu.be/[email or web address] Part 2 Dr. Derrill Kay with [facility] quick routine to practice daily - https://youtu.be/[email or web address]  Do not try to attempt the entire video when first beginning.  Try breaking of each exercise that he goes into shorter segments.  In other words, if they perform an exercise for 45 seconds, start with 15 seconds and rest and then resume when they begin the new activity.  If you work your way up to being able to do these videos without having to stop, I expect you will see significant improvements in your pain.  If you enjoy his videos and would like to find out more you can look on his website: [email or web address].  He has a workout streaming option as well as a DVD set available for purchase.  Amazon has the best price for his DVDs.      Please perform the exercise program that we have prepared for you and gone over in detail on a daily basis.  In addition to the handout you were provided you can access your program through: [email or web address]   Your unique program code is: [email or web address] and [id number]

## 2018-04-27 ENCOUNTER — Ambulatory Visit: Payer: Managed Care, Other (non HMO) | Admitting: Sports Medicine

## 2018-05-03 ENCOUNTER — Ambulatory Visit: Payer: Managed Care, Other (non HMO) | Admitting: Sports Medicine

## 2018-06-03 ENCOUNTER — Ambulatory Visit (INDEPENDENT_AMBULATORY_CARE_PROVIDER_SITE_OTHER): Payer: Managed Care, Other (non HMO) | Admitting: Physician Assistant

## 2018-06-03 ENCOUNTER — Ambulatory Visit: Payer: Self-pay | Admitting: Sports Medicine

## 2018-06-03 ENCOUNTER — Encounter: Payer: Self-pay | Admitting: Physician Assistant

## 2018-06-03 ENCOUNTER — Ambulatory Visit: Payer: Managed Care, Other (non HMO) | Admitting: Sports Medicine

## 2018-06-03 DIAGNOSIS — Z20822 Contact with and (suspected) exposure to covid-19: Secondary | ICD-10-CM

## 2018-06-03 DIAGNOSIS — R6889 Other general symptoms and signs: Secondary | ICD-10-CM

## 2018-06-03 DIAGNOSIS — Z7189 Other specified counseling: Secondary | ICD-10-CM

## 2018-06-03 NOTE — Telephone Encounter (Signed)
Alexander [name], schedule patient for virtual visit.

## 2018-06-03 NOTE — Telephone Encounter (Signed)
Forwarding to [employer].

## 2018-06-03 NOTE — Progress Notes (Signed)
Virtual Visit via Video   I connected with Alexander [name] on 06/03/18 at 12:20 PM EDT by a video enabled telemedicine application and verified that I am speaking with the correct person using two identifiers. Location patient: Home Location provider: [facility], Office Persons participating in the virtual visit: Alexander [name],    I discussed the limitations of evaluation and management by telemedicine and the availability of in person appointments. The patient expressed understanding and agreed to proceed.  Subjective:   HPI:  Cough Pt c/o non-productive cough , nasal congestion, fever and body aches started on Sunday. Fever has been around 103 but finally broke to 99 today. He is due to return to work today. He is feeling much improved. Pt works at [employer], which according to [employer] media, has had at least two confirmed COVID-19 cases.   Denies: inability to take PO's, dizziness, SOB, severe cough, chest pain  ROS: See pertinent positives and negatives per HPI.  Patient Active Problem List   Diagnosis Date Noted  . Anxiety 09/17/2016  . Morbid obesity (HCC) 09/17/2016    Social History   Tobacco Use  . Smoking status: Former Smoker    Packs/day: 1.00    Years: 10.00    Pack years: 10.00    Types: Cigarettes    Last attempt to quit: 11/16/2017    Years since quitting: 0.5  . Smokeless tobacco: Never Used  Substance Use Topics  . Alcohol use: Yes    Alcohol/week: 3.0 - 4.0 standard drinks    Types: 3 - 4 Cans of beer per week    Current Outpatient Medications:  .  anastrozole (ARIMIDEX) 1 MG tablet, , Disp: , Rfl:  .  clobetasol (TEMOVATE) 0.05 % external solution, , Disp: , Rfl:  .  ibuprofen (ADVIL,MOTRIN) 200 MG tablet, Take 200 mg by mouth as needed., Disp: , Rfl:   No Known Allergies  Objective:   VITALS: Per patient if applicable, see vitals. GENERAL: Alert, appears well and in no acute distress. HEENT: Atraumatic,  conjunctiva clear, no obvious abnormalities on inspection of external nose and ears. NECK: Normal movements of the head and neck. CARDIOPULMONARY: No increased WOB. Speaking in clear sentences. I:E ratio WNL.  MS: Moves all visible extremities without noticeable abnormality. PSYCH: Pleasant and cooperative, well-groomed. Speech normal rate and rhythm. Affect is appropriate. Insight and judgement are appropriate. Attention is focused, linear, and appropriate.  NEURO: CN grossly intact. Oriented as arrived to appointment on time with no prompting. Moves both UE equally.  SKIN: No obvious lesions, wounds, erythema, or cyanosis noted on face or hands.  Assessment and Plan:   Alexander [name] was seen today for cough.  Diagnoses and all orders for this visit:  Suspected Covid-19 Virus Infection  Advice Given About Covid-19 Virus Infection    I have strong suspicion patient has COVID-19. Currently doing well, however I did discuss warning signs and worsening precautions. Use of [facility] ED if indicated. Education provided. Work note for two weeks provided.   . Reviewed expectations re: course of current medical issues. . Discussed self-management of symptoms. . Outlined signs and symptoms indicating need for more acute intervention. . Patient verbalized understanding and all questions were answered. Marland Kitchen. Health Maintenance issues including appropriate healthy diet, exercise, and smoking avoidance were discussed with patient. . See orders for this visit as documented in the electronic medical record.  I discussed the assessment and treatment plan with the patient. The patient was provided an  opportunity to ask questions and all were answered. The patient agreed with the plan and demonstrated an understanding of the instructions.   The patient was advised to call back or seek an in-person evaluation if the symptoms worsen or if the condition fails to improve as anticipated.  CMA or LPN served as scribe during  this visit. History, Physical, and Plan performed by medical provider. The above documentation has been reviewed and is accurate and complete.    [place], [place] 06/03/2018

## 2018-06-03 NOTE — Telephone Encounter (Signed)
  Pt called in c/o fever, body aches, chills and a non productive cough and nasal congestion.    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   He does not have any exposure to it that he is aware of and no travels.  I attempted to warm transfer him to Dr. Janeece Riggers office however the line has been busy X3 attempts.   I let the pt know I would send a note and someone would be calling him back.  I explained about the video chat visits and he is agreeable to that.   I verified his phone 3 and e mail.  I sent these notes to the office of Dr. Berline Chough.  Reason for Disposition . Fever present > 3 days (72 hours)  Answer Assessment - Initial Assessment Questions 1. TEMPERATURE: "What is the most recent temperature?"  "How was it measured?"      101 this morning.   103 is the highest.   Axillary 2. ONSET: "When did the fever start?"      Sunday   I've been out of work.     3. SYMPTOMS: "Do you have any other symptoms besides the fever?"  (e.g., colds, headache, sore throat, earache, cough, rash, diarrhea, vomiting, abdominal pain)     I had body aches and cold.   I developed a cough later in the week.  But the cough seems to be getting better.  My girlfriend got me some cough drops which is really helping the cough.   I'm coughing and feeling congested in my nose.   I'm using Sudephed.    I feel much better. I work at a distribution center and I can't go to work with any of these symptoms.    I don't think I have the COVID-19 virus.    I think I had the flu because I've had it before.  No shortness of breath.  Some of my family are nurses so they been monitoring me.    I don't want to go to the ED and be exposed. 4. CAUSE: If there are no symptoms, ask: "What do you think is causing the fever?"      I think I had the flu. 5. CONTACTS: "Does anyone else in the family have an infection?"     No  I think maybe from work 6.  TREATMENT: "What have you done so far to treat this fever?" (e.g., medications)     Sudephed and cough drops. 7. IMMUNOCOMPROMISE: "Do you have of the following: diabetes, HIV positive, splenectomy, cancer chemotherapy, chronic steroid treatment, transplant patient, etc."     No 8. PREGNANCY: "Is there any chance you are pregnant?" "When was your last menstrual period?"     N/A 9. TRAVEL: "Have you traveled out of the country in the last month?" (e.g., travel history, exposures)     No travels  Protocols used: FEVER-A-AH

## 2018-06-09 ENCOUNTER — Encounter: Payer: Self-pay | Admitting: Physician Assistant

## 2018-06-09 ENCOUNTER — Ambulatory Visit (INDEPENDENT_AMBULATORY_CARE_PROVIDER_SITE_OTHER): Payer: Managed Care, Other (non HMO) | Admitting: Physician Assistant

## 2018-06-09 DIAGNOSIS — Z7189 Other specified counseling: Secondary | ICD-10-CM | POA: Diagnosis not present

## 2018-06-09 NOTE — Progress Notes (Signed)
Virtual Visit via Video   I connected with Alexander Levansyler Laquan Manfredi on 06/09/18 at 10:40 AM EDT by a video enabled telemedicine application and verified that I am speaking with the correct person using two identifiers. Location patient: Home Location provider: [facility], Office Persons participating in the virtual visit: Alexander [name], Jarold MottoSamantha Erskine Steinfeldt, [place]  I discussed the limitations of evaluation and management by telemedicine and the availability of in person appointments. The patient expressed understanding and agreed to proceed.  Subjective:   HPI:  Cough Pt following up today on presumed COVID-19 infection diagnosed on 06/03/2018.  He is doing significantly better. Last fever was at least 3 days ago. He is no longer taking tylenol or ibuprofen. Cough is almost completely gone, and is dry. Denies: SOB, chest pain, sore throat, fatigue.  Appetite is good. Feels well hydrated.  ROS: See pertinent positives and negatives per HPI.  Patient Active Problem List   Diagnosis Date Noted  . Anxiety 09/17/2016  . Morbid obesity (HCC) 09/17/2016    Social History   Tobacco Use  . Smoking status: Former Smoker    Packs/day: 1.00    Years: 10.00    Pack years: 10.00    Types: Cigarettes    Last attempt to quit: 11/16/2017    Years since quitting: 0.5  . Smokeless tobacco: Never Used  Substance Use Topics  . Alcohol use: Yes    Alcohol/week: 3.0 - 4.0 standard drinks    Types: 3 - 4 Cans of beer per week    Current Outpatient Medications:  .  anastrozole (ARIMIDEX) 1 MG tablet, , Disp: , Rfl:  .  clobetasol (TEMOVATE) 0.05 % external solution, , Disp: , Rfl:  .  ibuprofen (ADVIL,MOTRIN) 200 MG tablet, Take 200 mg by mouth as needed., Disp: , Rfl:   No Known Allergies  Objective:   VITALS: Per patient if applicable, see vitals. GENERAL: Alert, appears well and in no acute distress. HEENT: Atraumatic, conjunctiva clear, no obvious abnormalities on inspection of  external nose and ears. NECK: Normal movements of the head and neck. CARDIOPULMONARY: No increased WOB. Speaking in clear sentences. I:E ratio WNL.  MS: Moves all visible extremities without noticeable abnormality. PSYCH: Pleasant and cooperative, well-groomed. Speech normal rate and rhythm. Affect is appropriate. Insight and judgement are appropriate. Attention is focused, linear, and appropriate.  NEURO: CN grossly intact. Oriented as arrived to appointment on time with no prompting. Moves both UE equally.  SKIN: No obvious lesions, wounds, erythema, or cyanosis noted on face or hands.  Assessment and Plan:   [name] was seen today for cough.  Diagnoses and all orders for this visit:  Advice Given About Covid-19 Virus Infection   Clinically doing very well. No more scheduled follow-up. Discussed reaching out to us if anything comes up. If needs ER, [facility]  . Reviewed expectations re: course of current medical issues. . Discussed self-management of symptoms. . Outlined signs and symptoms indicating need for more acute intervention. . Patient verbalized understanding and all questions were answered. Marland Kitchen. Health Maintenance issues including appropriate healthy diet, exercise, and smoking avoidance were discussed with patient. . See orders for this visit as documented in the electronic medical record.  I discussed the assessment and treatment plan with the patient. The patient was provided an opportunity to ask questions and all were answered. The patient agreed with the plan and demonstrated an understanding of the instructions.   The patient was advised to call back  or seek an in-person evaluation if the symptoms worsen or if the condition fails to improve as anticipated.    [place], [place] 06/09/2018

## 2018-07-28 ENCOUNTER — Telehealth: Payer: Self-pay | Admitting: Physician Assistant

## 2018-07-28 NOTE — Telephone Encounter (Signed)
See note

## 2018-07-28 NOTE — Telephone Encounter (Signed)
Patient phoned requesting to speak with Inda Coke, PA. He would not disclose his need/provide any information. Just stating "she will know what this is about". Reported she is in clinic at this time but the message would be sent.

## 2018-07-29 ENCOUNTER — Ambulatory Visit (INDEPENDENT_AMBULATORY_CARE_PROVIDER_SITE_OTHER): Payer: Managed Care, Other (non HMO) | Admitting: Physician Assistant

## 2018-07-29 ENCOUNTER — Encounter: Payer: Self-pay | Admitting: Physician Assistant

## 2018-07-29 DIAGNOSIS — Z7189 Other specified counseling: Secondary | ICD-10-CM | POA: Diagnosis not present

## 2018-07-29 NOTE — Telephone Encounter (Signed)
Please schedule a time for patient to talk. Virtual or tele visit at his convenience.

## 2018-07-29 NOTE — Telephone Encounter (Signed)
Please see message. °

## 2018-07-29 NOTE — Progress Notes (Signed)
Virtual Visit via Video   I connected with Alexander [name] on 07/29/18 at  3:20 PM EDT by a video enabled telemedicine application and verified that I am speaking with the correct person using two identifiers. Location patient: Home Location provider: [facility], Office Persons participating in the virtual visit: Alexander [name], Inda Coke PA-C.  I discussed the limitations of evaluation and management by telemedicine and the availability of in person appointments. The patient expressed understanding and agreed to proceed.    Subjective:   HPI:   Patient would like to discuss his prior presumed COVID-19 diagnosis.  He was suspected to have COVID-19 during prior visit on 06/03/2018. He had an uneventful course and was able to recover with supportive care. He returned to work on 06/13/2018.  He is asking if he is safe to be around others, including his young daughter.  He presently denies: fever, cough, SOB, chills, malaise, diarrhea, loss of taste/smell  ROS: See pertinent positives and negatives per HPI.  Patient Active Problem List   Diagnosis Date Noted  . Anxiety 09/17/2016  . Morbid obesity ([place]) 09/17/2016    Social History   Tobacco Use  . Smoking status: Former Smoker    Packs/day: 1.00    Years: 10.00    Pack years: 10.00    Types: Cigarettes    Quit date: 11/16/2017    Years since quitting: 0.6  . Smokeless tobacco: Never Used  Substance Use Topics  . Alcohol use: Yes    Alcohol/week: 3.0 - 4.0 standard drinks    Types: 3 - 4 Cans of beer per week    Current Outpatient Medications:  .  anastrozole (ARIMIDEX) 1 MG tablet, , Disp: , Rfl:  .  clobetasol (TEMOVATE) 0.05 % external solution, , Disp: , Rfl:  .  ibuprofen (ADVIL,MOTRIN) 200 MG tablet, Take 200 mg by mouth as needed., Disp: , Rfl:   No Known Allergies  Objective:   VITALS: Per patient if applicable, see vitals. GENERAL: Alert, appears well and in no acute distress.  HEENT: Atraumatic, conjunctiva clear, no obvious abnormalities on inspection of external nose and ears. NECK: Normal movements of the head and neck. CARDIOPULMONARY: No increased WOB. Speaking in clear sentences. I:E ratio WNL.  MS: Moves all visible extremities without noticeable abnormality. PSYCH: Pleasant and cooperative, well-groomed. Speech normal rate and rhythm. Affect is appropriate. Insight and judgement are appropriate. Attention is focused, linear, and appropriate.  NEURO: CN grossly intact. Oriented as arrived to appointment on time with no prompting. Moves both UE equally.  SKIN: No obvious lesions, wounds, erythema, or cyanosis noted on face or hands.  Assessment and Plan:   Diagnoses and all orders for this visit:  Advice Given About Covid-19 Virus Infection   Discussed that he is safe to be around others while he takes continued precautions of hand hygiene, social distancing, wearing masks, and regular evaluation of his own symptoms.  Note provided. Follow-up if any concerns.  . Reviewed expectations re: course of current medical issues. . Discussed self-management of symptoms. . Outlined signs and symptoms indicating need for more acute intervention. . Patient verbalized understanding and all questions were answered. Marland Kitchen Health Maintenance issues including appropriate healthy diet, exercise, and smoking avoidance were discussed with patient. . See orders for this visit as documented in the electronic medical record.  I discussed the assessment and treatment plan with the patient. The patient was provided an opportunity to ask questions and all were answered. The patient agreed  with the plan and demonstrated an understanding of the instructions.   The patient was advised to call back or seek an in-person evaluation if the symptoms worsen or if the condition fails to improve as anticipated.   CMA or LPN served as scribe during this visit. History, Physical, and Plan  performed by medical provider. The above documentation has been reviewed and is accurate and complete.   [place] Anthonny Schiller, [place] 07/29/2018

## 2018-08-09 ENCOUNTER — Telehealth: Payer: Self-pay | Admitting: Physician Assistant

## 2018-08-09 NOTE — Telephone Encounter (Signed)
See note, patient just had a follow up appointment 6/12, was unsure if another was needed. Please contact patient  Copied from [place] [phone]. Topic: General - Other >> Aug 09, 2018  4:13 PM Nils Flack, Marland Kitchen wrote: Reason for CRM: pt would like a call back in regards to the process to get an emotional support dog.  Please call [phone]

## 2018-08-12 NOTE — Telephone Encounter (Signed)
Spoke to pt told him he would need a virtual visit to discuss with Aldona Bar and she will be back on Monday. Pt said he got it taken care found a quick and easy way on line. Told him okay.

## 2019-01-30 ENCOUNTER — Telehealth: Payer: Self-pay | Admitting: Internal Medicine

## 2019-01-30 NOTE — Telephone Encounter (Signed)
Alexander [name], have you seen a CMN?

## 2019-02-01 NOTE — Telephone Encounter (Signed)
We only did the home sleep study and Dr. Elsworth Soho read the study. Inda Coke ordered the home sleep study we have never seen the patient or order anything for him

## 2019-02-01 NOTE — Telephone Encounter (Signed)
LMTCB

## 2019-02-01 NOTE — Telephone Encounter (Signed)
Alexander Piety, do you have this CMN?  Thanks!

## 2019-02-02 NOTE — Telephone Encounter (Signed)
lmtcb X2 for pt. Pt needs to follow up with his PCP Inda Coke for CMN/any other cpap issues.  We only performed/read the test.  He is not a patient in our clinic.

## 2019-02-03 NOTE — Telephone Encounter (Signed)
Called pt and advised message from the provider. Pt understood and verbalized understanding. Nothing further is needed.    

## 2019-02-14 ENCOUNTER — Other Ambulatory Visit: Payer: Self-pay

## 2019-02-15 ENCOUNTER — Ambulatory Visit (INDEPENDENT_AMBULATORY_CARE_PROVIDER_SITE_OTHER): Payer: Managed Care, Other (non HMO) | Admitting: Physician Assistant

## 2019-02-15 ENCOUNTER — Other Ambulatory Visit: Payer: Self-pay

## 2019-02-15 ENCOUNTER — Encounter

## 2019-02-15 ENCOUNTER — Encounter: Payer: Self-pay | Admitting: Physician Assistant

## 2019-02-15 DIAGNOSIS — F419 Anxiety disorder, unspecified: Secondary | ICD-10-CM | POA: Diagnosis not present

## 2019-02-15 MED ORDER — HYDROXYZINE HCL 25 MG PO TABS: ORAL_TABLET | ORAL | 0 refills | Status: DC

## 2019-02-15 NOTE — Patient Instructions (Signed)
It was great to see you!  1. Please call [facility] to follow-up on your testosterone levels  2. I sent in Hydroxyzine for you to use as needed for anxiety/panic attacks  3. You will be contacted about your appointment with [facility]  Let's follow-up in 3 months, sooner if you have concerns.  Take care,  Inda Coke PA-C

## 2019-02-15 NOTE — Progress Notes (Signed)
Alexander [name] is a 28 y.o. male here for a follow up of a pre-existing problem.  I acted as a Education administrator for [employer], PA-C Anselmo Pickler, LPN  History of Present Illness:   Chief Complaint  Patient presents with  . Anxiety    HPI   Anxiety Pt said he was dx back in high school 12-13 yrs ago and has been able to deal with it, but the past 2-3 months it has been worse. Was on Lexapro at one point, for maybe up to one year. Pt had a panic attack Sunday night, his girlfriend drove him to the ER but he was able to calm himself down and didn't end up going into the ER. Denies SI/HI.  Feels more irritable recently; he is on testosterone managed by [facility].  GAD 7 : Generalized Anxiety Score 02/15/2019  Nervous, Anxious, on Edge 1  Control/stop worrying 2  Worry too much - different things 3  Trouble relaxing 2  Restless 2  Easily annoyed or irritable 3  Afraid - awful might happen 3  Total GAD 7 Score 16  Anxiety Difficulty Somewhat difficult   Obesity Has been trying to lose weight but continues to struggle. Has very physically demanding job but struggles with finding motivation to workout when not at work. Has significant stress in his life that occupies his time to take care of himself. He is hoping to get back on track with exercise and healthy eating with the new year.   Past Medical History:  Diagnosis Date  . Anxiety   . Obstructive sleep apnea    non compliant see mida      Social History   Socioeconomic History  . Marital status: Single    Spouse name: Not on file  . Number of children: Not on file  . Years of education: Not on file  . Highest education level: Not on file  Occupational History  . Not on file  Tobacco Use  . Smoking status: Former Smoker    Packs/day: 1.00    Years: 10.00    Pack years: 10.00    Types: Cigarettes    Quit date: 11/16/2017    Years since quitting: 1.2  . Smokeless tobacco: Never Used  Substance and Sexual  Activity  . Alcohol use: Yes    Alcohol/week: 3.0 - 4.0 standard drinks    Types: 3 - 4 Cans of beer per week  . Drug use: No  . Sexual activity: Yes  Other Topics Concern  . Not on file  Social History Narrative   [employer]   Daughter, 57 y/o -- sees 3-4 times a week   Social Determinants of Health   Financial Resource Strain:   . Difficulty of Paying Living Expenses: Not on file  Food Insecurity:   . Worried About Charity fundraiser in the Last Year: Not on file  . Ran Out of Food in the Last Year: Not on file  Transportation Needs:   . Lack of Transportation (Medical): Not on file  . Lack of Transportation (Non-Medical): Not on file  Physical Activity:   . Days of Exercise per Week: Not on file  . Minutes of Exercise per Session: Not on file  Stress:   . Feeling of Stress : Not on file  Social Connections:   . Frequency of Communication with Friends and Family: Not on file  . Frequency of Social Gatherings with Friends and Family: Not on file  .  Attends Religious Services: Not on file  . Active Member of Clubs or Organizations: Not on file  . Attends Banker Meetings: Not on file  . Marital Status: Not on file  Intimate Partner Violence:   . Fear of Current or Ex-Partner: Not on file  . Emotionally Abused: Not on file  . Physically Abused: Not on file  . Sexually Abused: Not on file    History reviewed. No pertinent surgical history.  Family History  Problem Relation Age of Onset  . Heart disease Maternal Grandfather   . Hypertension Maternal Grandfather     No Known Allergies  Current Medications:   Current Outpatient Medications:  .  ibuprofen (ADVIL,MOTRIN) 200 MG tablet, Take 200 mg by mouth as needed., Disp: , Rfl:  .  hydrOXYzine (ATARAX/VISTARIL) 25 MG tablet, Take one 25 mg tablet 30-60 minutes prior to bedtime for insomnia, anxiety. May increase to two tablets., Disp: 60 tablet, Rfl: 0   Review of Systems:    ROS Negative unless otherwise specified per HPI.  Vitals:   Vitals:   02/15/19 1346  BP: 140/90  Pulse: 78  Temp: 98.6 F (37 C)  TempSrc: Temporal  SpO2: 95%  Weight: (!) 392 lb 4 oz (177.9 kg)  Height: 6\' 4"  (1.93 m)     Body mass index is 47.75 kg/m.  Physical Exam:   Physical Exam Vitals and nursing note reviewed.  Constitutional:      General: He is not in acute distress.    Appearance: He is well-developed. He is not ill-appearing or toxic-appearing.  Cardiovascular:     Rate and Rhythm: Normal rate and regular rhythm.     Pulses: Normal pulses.     Heart sounds: Normal heart sounds, S1 normal and S2 normal.     Comments: No LE edema Pulmonary:     Effort: Pulmonary effort is normal.     Breath sounds: Normal breath sounds.  Skin:    General: Skin is warm and dry.  Neurological:     Mental Status: He is alert.     GCS: GCS eye subscore is 4. GCS verbal subscore is 5. GCS motor subscore is 6.  Psychiatric:        Speech: Speech normal.        Behavior: Behavior normal. Behavior is cooperative.     Assessment and Plan:   Nikkolas was seen today for anxiety.  Diagnoses and all orders for this visit:  Morbid obesity (HCC) -     Amb Ref to Medical Weight Management  Anxiety Situational. Will trial prn atarax for panic and anxious thoughts. Follow-up in 3 months, sooner if concerns. I discussed with patient that if they develop any SI, to tell someone immediately and seek medical attention.  Other orders -     hydrOXYzine (ATARAX/VISTARIL) 25 MG tablet; Take one 25 mg tablet 30-60 minutes prior to bedtime for insomnia, anxiety. May increase to two tablets.  . Reviewed expectations re: course of current medical issues. . Discussed self-management of symptoms. . Outlined signs and symptoms indicating need for more acute intervention. . Patient verbalized understanding and all questions were answered. . See orders for this visit as documented in the  electronic medical record. . Patient received an After-Visit Summary.  CMA or LPN served as scribe during this visit. History, Physical, and Plan performed by medical provider. The above documentation has been reviewed and is accurate and complete.  I spent 25 minutes with this patient, greater than 50%  was face-to-face time counseling regarding the above diagnoses.  Jarold MottoSamantha Katlyne Nishida, PA-C

## 2019-03-01 ENCOUNTER — Telehealth: Payer: Self-pay | Admitting: Physician Assistant

## 2019-03-01 NOTE — Telephone Encounter (Signed)
Patient called in saying he had an appointment last week to discuss getting supplies for the CPAP machine. He called [facility] but they said the only way he could get it is if his provider called them or put in an order for the machine. [facility] INFO: [email or web address] [phone]

## 2019-03-03 NOTE — Telephone Encounter (Signed)
Called [employer] at [phone] and left message to call me. This is where pt was getting supplies.

## 2019-03-03 NOTE — Telephone Encounter (Signed)
Called [facility] and spoke to a representative and was told [facility] is no longer part of [employer] so they are no longer paying for supplies and pt needs to contact [employer] to find out where to get supplies and how much will cost out of pocket. Told her thank you.

## 2019-03-03 NOTE — Telephone Encounter (Signed)
I have never done anything with DME's - you guys have always handled that.  He would need to call his insurance company to see who they use / recommend.

## 2019-03-03 NOTE — Telephone Encounter (Signed)
Alexander [name] can you look into this to see where pt can get his CPAP supplies and be covered by [place]. [facility] where pt was getting supplies is no longer covering. Thanks

## 2019-03-07 ENCOUNTER — Encounter: Payer: Self-pay | Admitting: Physician Assistant

## 2019-03-07 DIAGNOSIS — G4733 Obstructive sleep apnea (adult) (pediatric): Secondary | ICD-10-CM | POA: Insufficient documentation

## 2019-03-07 NOTE — Telephone Encounter (Signed)
Received form. Form filled out and faxed to [employer] at [phone].

## 2019-03-07 NOTE — Telephone Encounter (Signed)
Called [facility] supply and spoke to [facility]. Asked her if they are accepting [employer] I am trying to find where pt can get his CPAP supplies from and according to his chart he used to get them from [facility]. Pam said yes they do accept [employer] just need to send new order. Asked her to send me an order form and I will fax back over. Pam verbalized understanding and will fax form to me. Told her thank you.

## 2019-03-07 NOTE — Telephone Encounter (Signed)
Spoke to pt told him [facility] no longer takes his insurance and the program you were in is done. I called [facility] and they accept your insurance and I have faxed over orders to them for your CPAP supplies. Told pt you just need to contact them. Pt verbalized understanding and said his insurance has changed since Jan 1. Told pt when you call [facility] please give them the new insurance info and also call back to the office so it can be updated. Pt verbalized understanding.

## 2019-03-08 ENCOUNTER — Telehealth: Payer: Self-pay | Admitting: Physician Assistant

## 2019-03-08 NOTE — Telephone Encounter (Signed)
Pt called asking to be referred back to his sleep doctor. Please advise.

## 2019-03-09 NOTE — Telephone Encounter (Signed)
Spoke to pt said he wants a referral to go back to the sleep doctor to make sure everything is okay as far as his CPAP machine and being adjusted accordingly. Pt said he has never had a follow up. Told pt will discuss with Lelon Mast she will not be in the office till Monday and then I will get back to you and let you know what she wants to do.Pt verbalized understanding.

## 2019-03-13 ENCOUNTER — Other Ambulatory Visit: Payer: Self-pay | Admitting: Physician Assistant

## 2019-03-13 DIAGNOSIS — G4733 Obstructive sleep apnea (adult) (pediatric): Secondary | ICD-10-CM

## 2019-03-13 NOTE — Telephone Encounter (Signed)
Spoke to pt told him Lelon Mast has placed a referral for you with [facility]. They will be contacting you to schedule an appt. Told pt this is a different place then before so they can monitor your sleep apnea and CPAP machine. Pt verbalized understanding.

## 2019-03-13 NOTE — Telephone Encounter (Signed)
I placed a referral for [facility], please inform patient.  They are going to to hopefully take over his sleep apnea care.

## 2019-03-13 NOTE — Telephone Encounter (Signed)
Please see message and advise 

## 2019-03-15 ENCOUNTER — Other Ambulatory Visit: Payer: Self-pay | Admitting: Physician Assistant

## 2019-03-15 DIAGNOSIS — G4733 Obstructive sleep apnea (adult) (pediatric): Secondary | ICD-10-CM

## 2019-03-30 ENCOUNTER — Other Ambulatory Visit: Payer: Self-pay | Admitting: Urology

## 2019-05-02 ENCOUNTER — Ambulatory Visit (INDEPENDENT_AMBULATORY_CARE_PROVIDER_SITE_OTHER): Payer: Managed Care, Other (non HMO) | Admitting: Family Medicine

## 2019-05-02 ENCOUNTER — Other Ambulatory Visit: Payer: Self-pay

## 2019-05-02 ENCOUNTER — Encounter (INDEPENDENT_AMBULATORY_CARE_PROVIDER_SITE_OTHER): Payer: Self-pay | Admitting: Family Medicine

## 2019-05-02 VITALS — BP 123/77 | HR 88 | Temp 99.0°F | Ht 74.0 in | Wt 384.0 lb

## 2019-05-02 DIAGNOSIS — Z9189 Other specified personal risk factors, not elsewhere classified: Secondary | ICD-10-CM

## 2019-05-02 DIAGNOSIS — R5383 Other fatigue: Secondary | ICD-10-CM

## 2019-05-02 DIAGNOSIS — E7849 Other hyperlipidemia: Secondary | ICD-10-CM | POA: Diagnosis not present

## 2019-05-02 DIAGNOSIS — Z1331 Encounter for screening for depression: Secondary | ICD-10-CM

## 2019-05-02 DIAGNOSIS — R0602 Shortness of breath: Secondary | ICD-10-CM | POA: Diagnosis not present

## 2019-05-02 DIAGNOSIS — Z0289 Encounter for other administrative examinations: Secondary | ICD-10-CM

## 2019-05-02 DIAGNOSIS — Z6841 Body Mass Index (BMI) 40.0 and over, adult: Secondary | ICD-10-CM

## 2019-05-02 DIAGNOSIS — G4733 Obstructive sleep apnea (adult) (pediatric): Secondary | ICD-10-CM

## 2019-05-02 NOTE — Progress Notes (Signed)
Chief Complaint:   OBESITY Alexander [name] (MR# [id number]) is a 29 y.o. male who presents for evaluation and treatment of obesity and related comorbidities. Current BMI is Body mass index is 49.3 kg/m. Alexander [name] has been struggling with his weight for many years and has been unsuccessful in either losing weight, maintaining weight loss, or reaching his healthy weight goal.  Alexander [name] is currently in the action stage of change and ready to dedicate time achieving and maintaining a healthier weight. Alexander [name] is interested in becoming our patient and working on intensive lifestyle modifications including (but not limited to) diet and exercise for weight loss.  Alexander [name]'s habits were reviewed today and are as follows: His family eats meals together, he thinks his family will eat healthier with him, his desired weight loss is 109 lbs, he has been heavy most of his life, he started gaining weight in the last 10 years, his heaviest weight ever was 435 pounds, he is a picky eater and doesn't like to eat healthier foods, he has significant food cravings issues, he snacks frequently in the evenings, he skips meals frequently, he is frequently drinking liquids with calories, he frequently makes poor food choices, he has problems with excessive hunger, he frequently eats larger portions than normal and he struggles with emotional eating.  Depression Screen Alexander [name]'s Food and Mood (modified PHQ-9) score was 12.  Depression screen PHQ 2/9 05/02/2019  Decreased Interest 1  Down, Depressed, Hopeless 1  PHQ - 2 Score 2  Altered sleeping 3  Tired, decreased energy 3  Change in appetite 1  Feeling bad or failure about yourself  1  Trouble concentrating 2  Moving slowly or fidgety/restless 0  Suicidal thoughts 0  PHQ-9 Score 12  Difficult doing work/chores Not difficult at all   Subjective:   1. Other fatigue Alexander [name] admits to daytime somnolence and admits to waking up still tired. Patent has a history of  symptoms of daytime fatigue. Alexander [name] generally gets 5 or 7 hours of sleep per night, and states that he has nightime awakenings. Snoring is present. Apneic episodes are present. Epworth Sleepiness Score is 9.  2. Shortness of breath on exertion Alexander [name] notes increasing shortness of breath with exercising and seems to be worsening over time with weight gain. He notes getting out of breath sooner with activity than he used to. This has not gotten worse recently. Alexander [name] denies shortness of breath at rest or orthopnea.  3. OSA (obstructive sleep apnea) Alexander [name] reports 100 % compliance with nightly CPAP. He has alternated between full face mask and nasal pillows.  4. Other hyperlipidemia Alexander [name] has a history of elevated cholesterol levels. He is currently not on statin therapy. He is trying to normalize with lifestyle modifications.  5. At risk for hypertension The patient is at a higher than average risk of hypertension due to OSA.  Assessment/Plan:   1. Other fatigue Alexander [name] does feel that his weight is causing his energy to be lower than it should be. Fatigue may be related to obesity, depression or many other causes. Labs will be ordered, and in the meanwhile, Alexander [name] will focus on self care including making healthy food choices, increasing physical activity and focusing on stress reduction.  - EKG 12-Lead - CBC with Differential/Platelet - Folate - Hemoglobin A1c - Insulin, random - T3 - T4, free - TSH - Vitamin B12 - VITAMIN D 25 Hydroxy (Vit-D Deficiency, Fractures)  2. Shortness of breath on exertion Alexander [name] does feel that he  gets out of breath more easily that he used to when he exercises. Alexander [name]'s shortness of breath appears to be obesity related and exercise induced. He has agreed to work on weight loss and gradually increase exercise to treat his exercise induced shortness of breath. Will continue to monitor closely.  3. OSA (obstructive sleep apnea) Intensive lifestyle modifications are  the first line treatment for this issue. We discussed several lifestyle modifications today and Alexander [name] will continue with CPAP, and will continue to work on diet, exercise and weight loss efforts. We will continue to monitor. Orders and follow up as documented in patient record.   4. Other hyperlipidemia Cardiovascular risk and specific lipid/LDL goals reviewed. We discussed several lifestyle modifications today and Alexander [name] will start his Category 4 meal plan, and will continue to work on exercise and weight loss efforts. We will check labs today. Orders and follow up as documented in patient record.   - Comprehensive metabolic panel - Lipid Panel With LDL/HDL Ratio  5. Depression screening Alexander [name] had a positive depression screening. Depression is commonly associated with obesity and often results in emotional eating behaviors. We will monitor this closely and work on CBT to help improve the non-hunger eating patterns. Referral to Psychology may be required if no improvement is seen as he continues in our clinic.  6. At risk for hypertension Alexander [name] was given approximately 15 minutes of hypertension prevention counseling today. Alexander [name] is at risk for hypertension due to obesity. We discussed intensive lifestyle modifications today with an emphasis on weight loss as well as increasing exercise and decreasing salt intake.  Repetitive spaced learning was employed today to elicit superior memory formation and behavioral change.  7. Class 3 severe obesity with serious comorbidity and body mass index (BMI) of 45.0 to 49.9 in adult, unspecified obesity type [facility]) Alexander [name] is currently in the action stage of change and his goal is to continue with weight loss efforts. I recommend Alexander [name] begin the structured treatment plan as follows:  He has agreed to the Category 4 Plan.  Exercise goals: As is.   Behavioral modification strategies: decreasing eating out and no skipping meals.  He was informed  of the importance of frequent follow-up visits to maximize his success with intensive lifestyle modifications for his multiple health conditions. He was informed we would discuss his lab results at his next visit unless there is a critical issue that needs to be addressed sooner. Tyshawn agreed to keep his next visit at the agreed upon time to discuss these results.  Objective:   Blood pressure 123/77, pulse 88, temperature 99 F (37.2 C), temperature source Oral, height 6\' 2"  (1.88 m), weight (!) 384 lb (174.2 kg), SpO2 96 %. Body mass index is 49.3 kg/m.  EKG: Normal sinus rhythm, rate 85 BPM.  Indirect Calorimeter completed today shows a VO2 of 525 and a REE of 3651.  His calculated basal metabolic rate is thus his basal metabolic rate is better than expected.  General: Cooperative, alert, well developed, in no acute distress. HEENT: Conjunctivae and lids unremarkable. Cardiovascular: Regular rhythm.  Lungs: Normal work of breathing. Neurologic: No focal deficits.   Lab Results  Component Value Date   CREATININE 0.88 12/06/2017   BUN 14 12/06/2017   NA 138 12/06/2017   K 4.5 12/06/2017   CL 103 12/06/2017   CO2 28 12/06/2017   Lab Results  Component Value Date   ALT 20 12/06/2017   AST 15 12/06/2017   ALKPHOS 55  12/06/2017   BILITOT 1.2 12/06/2017   Lab Results  Component Value Date   HGBA1C 5.5 12/06/2017   No results found for: INSULIN Lab Results  Component Value Date   TSH 1.45 12/06/2017   Lab Results  Component Value Date   CHOL 172 12/06/2017   HDL 62.00 12/06/2017   LDLCALC 102 (H) 12/06/2017   TRIG 41.0 12/06/2017   CHOLHDL 3 12/06/2017   Lab Results  Component Value Date   WBC 4.0 12/06/2017   HGB 14.8 12/06/2017   HCT 44.4 12/06/2017   MCV 80.1 12/06/2017   PLT 206.0 12/06/2017   No results found for: IRON, TIBC, FERRITIN  Attestation Statements:   Reviewed by clinician on day of visit: allergies, medications, problem list, medical  history, surgical history, family history, social history, and previous encounter notes.   I, Trixie Dredge, am acting as transcriptionist for Dennard Nip, MD.  I have reviewed the above documentation for accuracy and completeness, and I agree with the above. - Dennard Nip, MD

## 2019-05-03 LAB — COMPREHENSIVE METABOLIC PANEL
ALT: 26 IU/L (ref 0–44)
AST: 23 IU/L (ref 0–40)
Albumin/Globulin Ratio: 1.5 (ref 1.2–2.2)
Albumin: 4.5 g/dL (ref 4.1–5.2)
Alkaline Phosphatase: 65 IU/L (ref 39–117)
BUN/Creatinine Ratio: 10 (ref 9–20)
BUN: 9 mg/dL (ref 6–20)
Bilirubin Total: 1.6 mg/dL — ABNORMAL HIGH (ref 0.0–1.2)
CO2: 22 mmol/L (ref 20–29)
Calcium: 9.6 mg/dL (ref 8.7–10.2)
Chloride: 101 mmol/L (ref 96–106)
Creatinine, Ser: 0.94 mg/dL (ref 0.76–1.27)
GFR calc Af Amer: 125 mL/min/{1.73_m2} (ref >59)
GFR calc non Af Amer: 108 mL/min/{1.73_m2} (ref >59)
Globulin, Total: 3 g/dL (ref 1.5–4.5)
Glucose: 85 mg/dL (ref 65–99)
Potassium: 4.6 mmol/L (ref 3.5–5.2)
Sodium: 138 mmol/L (ref 134–144)
Total Protein: 7.5 g/dL (ref 6.0–8.5)

## 2019-05-03 LAB — HEMOGLOBIN A1C
Est. average glucose Bld gHb Est-mCnc: 108 mg/dL
Hgb A1c MFr Bld: 5.4 % (ref 4.8–5.6)

## 2019-05-03 LAB — LIPID PANEL WITH LDL/HDL RATIO
Cholesterol, Total: 190 mg/dL (ref 100–199)
HDL: 62 mg/dL (ref >39)
LDL Chol Calc (NIH): 115 mg/dL — ABNORMAL HIGH (ref 0–99)
LDL/HDL Ratio: 1.9 ratio (ref 0.0–3.6)
Triglycerides: 72 mg/dL (ref 0–149)
VLDL Cholesterol Cal: 13 mg/dL (ref 5–40)

## 2019-05-03 LAB — INSULIN, RANDOM: INSULIN: 8.1 u[IU]/mL (ref 2.6–24.9)

## 2019-05-03 LAB — VITAMIN D 25 HYDROXY (VIT D DEFICIENCY, FRACTURES): Vit D, 25-Hydroxy: 27.4 ng/mL — ABNORMAL LOW (ref 30.0–100.0)

## 2019-05-03 LAB — CBC WITH DIFFERENTIAL/PLATELET
Basophils Absolute: 0 10*3/uL (ref 0.0–0.2)
Basos: 0 %
EOS (ABSOLUTE): 0.1 10*3/uL (ref 0.0–0.4)
Eos: 1 %
Hematocrit: 49 % (ref 37.5–51.0)
Hemoglobin: 15.6 g/dL (ref 13.0–17.7)
Immature Grans (Abs): 0 10*3/uL (ref 0.0–0.1)
Immature Granulocytes: 1 %
Lymphocytes Absolute: 1.5 10*3/uL (ref 0.7–3.1)
Lymphs: 35 %
MCH: 25.5 pg — ABNORMAL LOW (ref 26.6–33.0)
MCHC: 31.8 g/dL (ref 31.5–35.7)
MCV: 80 fL (ref 79–97)
Monocytes Absolute: 0.5 10*3/uL (ref 0.1–0.9)
Monocytes: 11 %
Neutrophils Absolute: 2.2 10*3/uL (ref 1.4–7.0)
Neutrophils: 52 %
Platelets: 210 10*3/uL (ref 150–450)
RBC: 6.11 x10E6/uL — ABNORMAL HIGH (ref 4.14–5.80)
RDW: 14.6 % (ref 11.6–15.4)
WBC: 4.3 10*3/uL (ref 3.4–10.8)

## 2019-05-03 LAB — TSH: TSH: 1.22 u[IU]/mL (ref 0.450–4.500)

## 2019-05-03 LAB — T3: T3, Total: 115 ng/dL (ref 71–180)

## 2019-05-03 LAB — FOLATE: Folate: >20 ng/mL (ref >3.0)

## 2019-05-03 LAB — T4, FREE: Free T4: 1.28 ng/dL (ref 0.82–1.77)

## 2019-05-03 LAB — VITAMIN B12: Vitamin B-12: 478 pg/mL (ref 232–1245)

## 2019-05-16 ENCOUNTER — Ambulatory Visit: Payer: Managed Care, Other (non HMO) | Admitting: Physician Assistant

## 2019-05-16 ENCOUNTER — Other Ambulatory Visit: Payer: Self-pay

## 2019-05-16 ENCOUNTER — Encounter (INDEPENDENT_AMBULATORY_CARE_PROVIDER_SITE_OTHER): Payer: Self-pay | Admitting: Family Medicine

## 2019-05-16 ENCOUNTER — Ambulatory Visit (INDEPENDENT_AMBULATORY_CARE_PROVIDER_SITE_OTHER): Payer: Managed Care, Other (non HMO) | Admitting: Family Medicine

## 2019-05-16 VITALS — BP 132/76 | HR 85 | Temp 98.6°F | Ht 74.0 in | Wt 371.0 lb

## 2019-05-16 DIAGNOSIS — E7849 Other hyperlipidemia: Secondary | ICD-10-CM

## 2019-05-16 DIAGNOSIS — E559 Vitamin D deficiency, unspecified: Secondary | ICD-10-CM | POA: Diagnosis not present

## 2019-05-16 DIAGNOSIS — Z9189 Other specified personal risk factors, not elsewhere classified: Secondary | ICD-10-CM | POA: Diagnosis not present

## 2019-05-16 DIAGNOSIS — Z6841 Body Mass Index (BMI) 40.0 and over, adult: Secondary | ICD-10-CM

## 2019-05-16 MED ORDER — VITAMIN D (ERGOCALCIFEROL) 1.25 MG (50000 UNIT) PO CAPS: 50000.0000 [IU] | ORAL_CAPSULE | ORAL | 0 refills | Status: DC

## 2019-05-17 NOTE — Progress Notes (Signed)
Chief Complaint:   OBESITY Alexander [name] is here to discuss his progress with his obesity treatment plan along with follow-up of his obesity related diagnoses. Alexander [name] is on the Category 4 Plan and states he is following his eating plan approximately 95% of the time. Alexander [name] states he is doing 0 minutes 0 times per week.  Today's visit was #: 2 Starting weight: 384 lbs Starting date: 05/02/2019 Today's weight: 371 lbs Today's date: 05/16/2019 Total lbs lost to date: 13 Total lbs lost since last in-office visit: 13  Interim History: Alexander [name] states he feels great. He reports increased hunger late in the afternoon. He has really enjoyed the structure of the Category 4 meal plan. He was unable to drink the milk due to the taste.  Subjective:   1. Other hyperlipidemia Alexander [name]'s lipid panel on 05/02/2019 showed a total cholesterol of 190, triglycerides 72, HDL 62, and LDL 115. She is not on statin therapy.  2. Vitamin D deficiency Alexander [name]'s Vit D level was 27 on 05/02/2019. I recommended starting prescription strength supplementation.  3. At risk for dehydration Alexander [name] is at risk for dehydration due to rapid weight loss.  Assessment/Plan:   1. Other hyperlipidemia Cardiovascular risk and specific lipid/LDL goals reviewed. We discussed several lifestyle modifications today. Alexander [name] will continue his Category 4 meal plan, and will continue to work on exercise and weight loss efforts. We will recheck labs in 3 months. Orders and follow up as documented in patient record.   2. Vitamin D deficiency Low Vitamin D level contributes to fatigue and are associated with obesity, breast, and colon cancer. Alexander [name] agreed to start prescription Vitamin D 50,000 IU every week with no refills. He will follow-up for routine testing of Vitamin D, at least 2-3 times per year to avoid over-replacement. We will recheck labs in 3 months.  - Vitamin D, Ergocalciferol, (DRISDOL) 1.25 MG (50000 UNIT) CAPS capsule; Take 1 capsule  (50,000 Units total) by mouth every 7 (seven) days.  Dispense: 4 capsule; Refill: 0  3. At risk for dehydration Alexander [name] was given approximately 30 minutes dehydration prevention counseling today. Alexander [name] is at risk for dehydration due to weight loss and current medication(s). He was encouraged to hydrate and monitor fluid status to avoid dehydration as well as weight loss plateaus.   4. Class 3 severe obesity with serious comorbidity and body mass index (BMI) of 45.0 to 49.9 in adult, unspecified obesity type [facility]) Alexander [name] is currently in the action stage of change. As such, his goal is to continue with weight loss efforts. He has agreed to the Category 4 Plan. Alexander [name] may add mayo to his lunch sandwich.   Exercise goals: Alexander [name] will continue walking with his dog approximately 1 mile.  Behavioral modification strategies: no skipping meals and meal planning and cooking strategies.  Alexander [name] has agreed to follow-up with our clinic in 2 weeks. He was informed of the importance of frequent follow-up visits to maximize his success with intensive lifestyle modifications for his multiple health conditions.   Objective:   Blood pressure 132/76, pulse 85, temperature 98.6 F (37 C), temperature source Oral, height 6\' 2"  (1.88 m), weight (!) 371 lb (168.3 kg), SpO2 97 %. Body mass index is 47.63 kg/m.  General: Cooperative, alert, well developed, in no acute distress. HEENT: Conjunctivae and lids unremarkable. Cardiovascular: Regular rhythm.  Lungs: Normal work of breathing. Neurologic: No focal deficits.   Lab Results  Component Value Date   CREATININE 0.94 05/02/2019   BUN 9 05/02/2019  NA 138 05/02/2019   K 4.6 05/02/2019   CL 101 05/02/2019   CO2 22 05/02/2019   Lab Results  Component Value Date   ALT 26 05/02/2019   AST 23 05/02/2019   ALKPHOS 65 05/02/2019   BILITOT 1.6 (H) 05/02/2019   Lab Results  Component Value Date   HGBA1C 5.4 05/02/2019   HGBA1C 5.5 12/06/2017   Lab Results   Component Value Date   INSULIN 8.1 05/02/2019   Lab Results  Component Value Date   TSH 1.220 05/02/2019   Lab Results  Component Value Date   CHOL 190 05/02/2019   HDL 62 05/02/2019   LDLCALC 115 (H) 05/02/2019   TRIG 72 05/02/2019   CHOLHDL 3 12/06/2017   Lab Results  Component Value Date   WBC 4.3 05/02/2019   HGB 15.6 05/02/2019   HCT 49.0 05/02/2019   MCV 80 05/02/2019   PLT 210 05/02/2019   No results found for: IRON, TIBC, FERRITIN  Attestation Statements:   Reviewed by clinician on day of visit: allergies, medications, problem list, medical history, surgical history, family history, social history, and previous encounter notes.   I, Trixie Dredge, am acting as transcriptionist for Dennard Nip, MD.  I have reviewed the above documentation for accuracy and completeness, and I agree with the above. -  Dennard Nip, MD

## 2019-06-01 ENCOUNTER — Other Ambulatory Visit: Payer: Self-pay

## 2019-06-01 ENCOUNTER — Ambulatory Visit (INDEPENDENT_AMBULATORY_CARE_PROVIDER_SITE_OTHER): Payer: Managed Care, Other (non HMO) | Admitting: Physician Assistant

## 2019-06-01 ENCOUNTER — Encounter (INDEPENDENT_AMBULATORY_CARE_PROVIDER_SITE_OTHER): Payer: Self-pay | Admitting: Physician Assistant

## 2019-06-01 VITALS — BP 119/72 | HR 86 | Temp 98.2°F | Ht 74.0 in | Wt 365.0 lb

## 2019-06-01 DIAGNOSIS — Z6841 Body Mass Index (BMI) 40.0 and over, adult: Secondary | ICD-10-CM | POA: Diagnosis not present

## 2019-06-01 DIAGNOSIS — E559 Vitamin D deficiency, unspecified: Secondary | ICD-10-CM

## 2019-06-01 NOTE — Progress Notes (Signed)
Chief Complaint:   OBESITY Alexander [name] is here to discuss his progress with his obesity treatment plan along with follow-up of his obesity related diagnoses. Alexander [name] is on the Category 4 Plan and states he is following his eating plan approximately 95% of the time. Alexander [name] states he is exercising for 0 minutes 0 times per week.  Today's visit was #: 3 Starting weight: 384 lbs Starting date: 05/02/2019 Today's weight: 365 lbs Today's date: 06/01/2019 Total lbs lost to date: 19 lbs Total lbs lost since last in-office visit: 6 lbs  Interim History: Alexander [name] reports that for the first time in a month he had fast food this past weekend.  He states that he felt terrible afterwards.  Otherwise, he has followed the plan well and is enjoying it.  Subjective:   1. Vitamin D deficiency Alexander [name]'s Vitamin D level was 27.4 on 05/02/2019. He is currently taking no vitamin D supplement because he has not picked it up form the pharmacy. He denies nausea, vomiting or muscle weakness.  Assessment/Plan:   1. Vitamin D deficiency Low Vitamin D level contributes to fatigue and are associated with obesity, breast, and colon cancer. He agrees to start to take prescription Vitamin D @50 ,000 IU every week and will follow-up for routine testing of Vitamin D, at least 2-3 times per year to avoid over-replacement.  2. Class 3 severe obesity with serious comorbidity and body mass index (BMI) of 45.0 to 49.9 in adult, unspecified obesity type Alexander [name]) Alexander [name] is currently in the action stage of change. As such, his goal is to continue with weight loss efforts. He has agreed to the Category 4 Plan.   Exercise goals: As is.  Behavioral modification strategies: meal planning and cooking strategies and planning for success.  Alexander [name] has agreed to follow-up with our clinic in 2 weeks. He was informed of the importance of frequent follow-up visits to maximize his success with intensive lifestyle modifications for his multiple health  conditions.   Objective:   Blood pressure 119/72, pulse 86, temperature 98.2 F (36.8 C), temperature source Oral, height 6\' 2"  (1.88 m), weight (!) 365 lb (165.6 kg), SpO2 97 %. Body mass index is 46.86 kg/m.  General: Cooperative, alert, well developed, in no acute distress. HEENT: Conjunctivae and lids unremarkable. Cardiovascular: Regular rhythm.  Lungs: Normal work of breathing. Neurologic: No focal deficits.   Lab Results  Component Value Date   CREATININE 0.94 05/02/2019   BUN 9 05/02/2019   NA 138 05/02/2019   K 4.6 05/02/2019   CL 101 05/02/2019   CO2 22 05/02/2019   Lab Results  Component Value Date   ALT 26 05/02/2019   AST 23 05/02/2019   ALKPHOS 65 05/02/2019   BILITOT 1.6 (H) 05/02/2019   Lab Results  Component Value Date   HGBA1C 5.4 05/02/2019   HGBA1C 5.5 12/06/2017   Lab Results  Component Value Date   INSULIN 8.1 05/02/2019   Lab Results  Component Value Date   TSH 1.220 05/02/2019   Lab Results  Component Value Date   CHOL 190 05/02/2019   HDL 62 05/02/2019   LDLCALC 115 (H) 05/02/2019   TRIG 72 05/02/2019   CHOLHDL 3 12/06/2017   Lab Results  Component Value Date   WBC 4.3 05/02/2019   HGB 15.6 05/02/2019   HCT 49.0 05/02/2019   MCV 80 05/02/2019   PLT 210 05/02/2019   Attestation Statements:   Reviewed by clinician on day of visit: allergies, medications, problem list,  medical history, surgical history, family history, social history, and previous encounter notes.  Time spent on visit including pre-visit chart review and post-visit care and charting was 24 minutes.   I, Water quality scientist, CMA, am acting as Location manager for [employer], PA-C.  I have reviewed the above documentation for accuracy and completeness, and I agree with the above. Abby Potash, PA-C

## 2019-06-13 ENCOUNTER — Other Ambulatory Visit: Payer: Self-pay

## 2019-06-13 ENCOUNTER — Ambulatory Visit (INDEPENDENT_AMBULATORY_CARE_PROVIDER_SITE_OTHER): Payer: Managed Care, Other (non HMO) | Admitting: Physician Assistant

## 2019-06-13 ENCOUNTER — Encounter (INDEPENDENT_AMBULATORY_CARE_PROVIDER_SITE_OTHER): Payer: Self-pay | Admitting: Physician Assistant

## 2019-06-13 VITALS — BP 145/80 | HR 79 | Temp 97.7°F | Ht 74.0 in | Wt 363.0 lb

## 2019-06-13 DIAGNOSIS — Z6841 Body Mass Index (BMI) 40.0 and over, adult: Secondary | ICD-10-CM | POA: Diagnosis not present

## 2019-06-13 DIAGNOSIS — E7849 Other hyperlipidemia: Secondary | ICD-10-CM

## 2019-06-13 NOTE — Progress Notes (Signed)
Chief Complaint:   OBESITY Alexander [name] is here to discuss his progress with his obesity treatment plan along with follow-up of his obesity related diagnoses. [name] is on the Category 4 Plan and states he is following his eating plan approximately 95% of the time. Toron states he is playing basketball 60 minutes 1 time per week.  Today's visit was #: 4 Starting weight: 384 lbs Starting date: 05/02/2019 Today's weight: 363 lbs Today's date: 06/13/2019 Total lbs lost to date: 21 Total lbs lost since last in-office visit: 2  Interim History: Alexander [name] states that he has not been eating all of the food on the plan. He is not drinking the milk and has not been substituting anything.  He is ready to get back on track.  Subjective:   Other hyperlipidemia. [name] has hyperlipidemia and has been trying to improve his cholesterol levels with intensive lifestyle modification including a low saturated fat diet, exercise and weight loss. He denies any chest pain, claudication or myalgias. Alexander [name] is on no medication. No chest pain. He is not exercising regularly.  Lab Results  Component Value Date   ALT 26 05/02/2019   AST 23 05/02/2019   ALKPHOS 65 05/02/2019   BILITOT 1.6 (H) 05/02/2019   Lab Results  Component Value Date   CHOL 190 05/02/2019   HDL 62 05/02/2019   LDLCALC 115 (H) 05/02/2019   TRIG 72 05/02/2019   CHOLHDL 3 12/06/2017   Assessment/Plan:   Other hyperlipidemia. Cardiovascular risk and specific lipid/LDL goals reviewed.  We discussed several lifestyle modifications today and [name] will continue to work on diet, exercise and weight loss efforts. Orders and follow up as documented in patient record.   Counseling Intensive lifestyle modifications are the first line treatment for this issue. . Dietary changes: Increase soluble fiber. Decrease simple carbohydrates. . Exercise changes: Moderate to vigorous-intensity aerobic activity 150 minutes per week if  tolerated. . Lipid-lowering medications: see documented in medical record.  Class 3 severe obesity with serious comorbidity and body mass index (BMI) of 45.0 to 49.9 in adult, unspecified obesity type ([facility]).  Alexander [name] is currently in the action stage of change. As such, his goal is to continue with weight loss efforts. He has agreed to the Category 4 Plan.   Exercise goals: All adults should avoid inactivity. Some physical activity is better than none, and adults who participate in any amount of physical activity gain some health benefits.  Behavioral modification strategies: no skipping meals and meal planning and cooking strategies.  Alexander [name] has agreed to follow-up with our clinic in 2 weeks. He was informed of the importance of frequent follow-up visits to maximize his success with intensive lifestyle modifications for his multiple health conditions.   Objective:   Blood pressure (!) 145/80, pulse 79, temperature 97.7 F (36.5 C), temperature source Oral, height 6\' 2"  (1.88 m), weight (!) 363 lb (164.7 kg), SpO2 96 %. Body mass index is 46.61 kg/m.  General: Cooperative, alert, well developed, in no acute distress. HEENT: Conjunctivae and lids unremarkable. Cardiovascular: Regular rhythm.  Lungs: Normal work of breathing. Neurologic: No focal deficits.   Lab Results  Component Value Date   CREATININE 0.94 05/02/2019   BUN 9 05/02/2019   NA 138 05/02/2019   K 4.6 05/02/2019   CL 101 05/02/2019   CO2 22 05/02/2019   Lab Results  Component Value Date   ALT 26 05/02/2019   AST 23 05/02/2019   ALKPHOS 65 05/02/2019   BILITOT  1.6 (H) 05/02/2019   Lab Results  Component Value Date   HGBA1C 5.4 05/02/2019   HGBA1C 5.5 12/06/2017   Lab Results  Component Value Date   INSULIN 8.1 05/02/2019   Lab Results  Component Value Date   TSH 1.220 05/02/2019   Lab Results  Component Value Date   CHOL 190 05/02/2019   HDL 62 05/02/2019   LDLCALC 115 (H) 05/02/2019   TRIG 72  05/02/2019   CHOLHDL 3 12/06/2017   Lab Results  Component Value Date   WBC 4.3 05/02/2019   HGB 15.6 05/02/2019   HCT 49.0 05/02/2019   MCV 80 05/02/2019   PLT 210 05/02/2019   No results found for: IRON, TIBC, FERRITIN  Attestation Statements:   Reviewed by clinician on day of visit: allergies, medications, problem list, medical history, surgical history, family history, social history, and previous encounter notes.  Time spent on visit including pre-visit chart review and post-visit charting and care was 31 minutes.    IMarianna Payment, am acting as transcriptionist for Alois Cliche, PA-C   I have reviewed the above documentation for accuracy and completeness, and I agree with the above. Alois Cliche, PA-C

## 2019-06-26 ENCOUNTER — Other Ambulatory Visit: Payer: Self-pay

## 2019-06-26 ENCOUNTER — Encounter (INDEPENDENT_AMBULATORY_CARE_PROVIDER_SITE_OTHER): Payer: Self-pay | Admitting: Physician Assistant

## 2019-06-26 ENCOUNTER — Ambulatory Visit (INDEPENDENT_AMBULATORY_CARE_PROVIDER_SITE_OTHER): Payer: Managed Care, Other (non HMO) | Admitting: Physician Assistant

## 2019-06-26 VITALS — BP 139/79 | HR 79 | Temp 98.1°F | Ht 74.0 in | Wt 361.0 lb

## 2019-06-26 DIAGNOSIS — E7849 Other hyperlipidemia: Secondary | ICD-10-CM | POA: Diagnosis not present

## 2019-06-26 DIAGNOSIS — Z9189 Other specified personal risk factors, not elsewhere classified: Secondary | ICD-10-CM | POA: Diagnosis not present

## 2019-06-26 DIAGNOSIS — Z6841 Body Mass Index (BMI) 40.0 and over, adult: Secondary | ICD-10-CM

## 2019-06-26 DIAGNOSIS — E559 Vitamin D deficiency, unspecified: Secondary | ICD-10-CM | POA: Diagnosis not present

## 2019-06-27 MED ORDER — VITAMIN D (ERGOCALCIFEROL) 1.25 MG (50000 UNIT) PO CAPS: 50000.0000 [IU] | ORAL_CAPSULE | ORAL | 0 refills | Status: DC

## 2019-06-27 NOTE — Progress Notes (Signed)
Chief Complaint:   OBESITY Alexander [name] is here to discuss his progress with his obesity treatment plan along with follow-up of his obesity related diagnoses. Alexander [name] is on the Category 4 Plan and states he is following his eating plan approximately 90-95% of the time. Alexander [name] states he is exercising 0 minutes 0 times per week.  Today's visit was #: 5 Starting weight: 384 lbs Starting date: 05/02/2019 Today's weight: 361 lbs Today's date: 06/26/2019 Total lbs lost to date: 23 Total lbs lost since last in-office visit: 2  Interim History: Alexander [name] reports that he is getting tired of the meal plan and is looking for more variety.  Subjective:   Vitamin D deficiency. Alexander [name] is on Vitamin D. No nausea, vomiting, or muscle weakness. Last Vitamin D 27.4 on 05/02/2019.  Other hyperlipidemia. Alexander [name] has hyperlipidemia and has been trying to improve his cholesterol levels with intensive lifestyle modification including a low saturated fat diet, exercise and weight loss. He denies any chest pain. He is on no medication.  Lab Results  Component Value Date   ALT 26 05/02/2019   AST 23 05/02/2019   ALKPHOS 65 05/02/2019   BILITOT 1.6 (H) 05/02/2019   Lab Results  Component Value Date   CHOL 190 05/02/2019   HDL 62 05/02/2019   LDLCALC 115 (H) 05/02/2019   TRIG 72 05/02/2019   CHOLHDL 3 12/06/2017   At risk for heart disease. Alexander [name] is at a higher than average risk for cardiovascular disease due to obesity.   Assessment/Plan:   Vitamin D deficiency. Low Vitamin D level contributes to fatigue and are associated with obesity, breast, and colon cancer. He was given a refill on his Vitamin D, Ergocalciferol, (DRISDOL) 1.25 MG (50000 UNIT) CAPS capsule every week #4 with 0 refills and will follow-up for routine testing of Vitamin D, at least 2-3 times per year to avoid over-replacement.    Other hyperlipidemia. Cardiovascular risk and specific lipid/LDL goals reviewed.  We discussed  several lifestyle modifications today and Alexander [name] will continue to work on diet, exercise and weight loss efforts. Orders and follow up as documented in patient record.   Counseling Intensive lifestyle modifications are the first line treatment for this issue. . Dietary changes: Increase soluble fiber. Decrease simple carbohydrates. . Exercise changes: Moderate to vigorous-intensity aerobic activity 150 minutes per week if tolerated. . Lipid-lowering medications: see documented in medical record.  At risk for heart disease. Alexander [name] was given approximately 15 minutes of coronary artery disease prevention counseling today. He is 29 y.o. male and has risk factors for heart disease including obesity. We discussed intensive lifestyle modifications today with an emphasis on specific weight loss instructions and strategies.   Repetitive spaced learning was employed today to elicit superior memory formation and behavioral change.  Class 3 severe obesity with serious comorbidity and body mass index (BMI) of 45.0 to 49.9 in adult, unspecified obesity type (Alexander [name]).  Alexander [name] is currently in the action stage of change. As such, his goal is to continue with weight loss efforts. He has agreed to keeping a food journal and adhering to recommended goals of 1800-1900 calories and 115 grams of protein daily.   Exercise goals: For substantial health benefits, adults should do at least 150 minutes (2 hours and 30 minutes) a week of moderate-intensity, or 75 minutes (1 hour and 15 minutes) a week of vigorous-intensity aerobic physical activity, or an equivalent combination of moderate- and vigorous-intensity aerobic activity. Aerobic activity should be performed in episodes  of at least 10 minutes, and preferably, it should be spread throughout the week.  Behavioral modification strategies: meal planning and cooking strategies, planning for success and keeping a strict food journal.  Alexander [name] has agreed to follow-up with our  clinic in 2 weeks. He was informed of the importance of frequent follow-up visits to maximize his success with intensive lifestyle modifications for his multiple health conditions.   Objective:   Blood pressure 139/79, pulse 79, temperature 98.1 F (36.7 C), temperature source Oral, height 6\' 2"  (1.88 m), weight (!) 361 lb (163.7 kg), SpO2 97 %. Body mass index is 46.35 kg/m.  General: Cooperative, alert, well developed, in no acute distress. HEENT: Conjunctivae and lids unremarkable. Cardiovascular: Regular rhythm.  Lungs: Normal work of breathing. Neurologic: No focal deficits.   Lab Results  Component Value Date   CREATININE 0.94 05/02/2019   BUN 9 05/02/2019   NA 138 05/02/2019   K 4.6 05/02/2019   CL 101 05/02/2019   CO2 22 05/02/2019   Lab Results  Component Value Date   ALT 26 05/02/2019   AST 23 05/02/2019   ALKPHOS 65 05/02/2019   BILITOT 1.6 (H) 05/02/2019   Lab Results  Component Value Date   HGBA1C 5.4 05/02/2019   HGBA1C 5.5 12/06/2017   Lab Results  Component Value Date   INSULIN 8.1 05/02/2019   Lab Results  Component Value Date   TSH 1.220 05/02/2019   Lab Results  Component Value Date   CHOL 190 05/02/2019   HDL 62 05/02/2019   LDLCALC 115 (H) 05/02/2019   TRIG 72 05/02/2019   CHOLHDL 3 12/06/2017   Lab Results  Component Value Date   WBC 4.3 05/02/2019   HGB 15.6 05/02/2019   HCT 49.0 05/02/2019   MCV 80 05/02/2019   PLT 210 05/02/2019   No results found for: IRON, TIBC, FERRITIN  Attestation Statements:   Reviewed by clinician on day of visit: allergies, medications, problem list, medical history, surgical history, family history, social history, and previous encounter notes.  I03/18/2021, am acting as transcriptionist for Marianna Payment, PA-C   I have reviewed the above documentation for accuracy and completeness, and I agree with the above. Alois Cliche, PA-C

## 2019-07-13 ENCOUNTER — Other Ambulatory Visit: Payer: Self-pay

## 2019-07-13 ENCOUNTER — Encounter (INDEPENDENT_AMBULATORY_CARE_PROVIDER_SITE_OTHER): Payer: Self-pay | Admitting: Physician Assistant

## 2019-07-13 ENCOUNTER — Ambulatory Visit (INDEPENDENT_AMBULATORY_CARE_PROVIDER_SITE_OTHER): Payer: Managed Care, Other (non HMO) | Admitting: Physician Assistant

## 2019-07-13 VITALS — BP 141/73 | HR 93 | Temp 98.6°F | Ht 74.0 in | Wt 364.0 lb

## 2019-07-13 DIAGNOSIS — E7849 Other hyperlipidemia: Secondary | ICD-10-CM | POA: Diagnosis not present

## 2019-07-13 DIAGNOSIS — Z6841 Body Mass Index (BMI) 40.0 and over, adult: Secondary | ICD-10-CM

## 2019-07-13 NOTE — Progress Notes (Signed)
Chief Complaint:   OBESITY Alexander [name] is here to discuss his progress with his obesity treatment plan along with follow-up of his obesity related diagnoses. Alexander [name] is on the Category 4 Plan and states he is following his eating plan approximately 85% of the time. Alexander [name] states he is exercising 0 minutes 0 times per week.  Today's visit was #: 6 Starting weight: 384 lbs Starting date: 05/02/2019 Today's weight: 364 lbs Today's date: 07/13/2019 Total lbs lost to date: 20 Total lbs lost since last in-office visit: 0  Interim History: Alexander [name] states he has not been as disciplined as usual. He also reports that he had fast food twice this week. He got away from weighing his food, but states he is ready to restart.  Subjective:   Other hyperlipidemia. Alexander [name] has hyperlipidemia and has been trying to improve his cholesterol levels with intensive lifestyle modification including a low saturated fat diet, exercise and weight loss. He denies any chest pain. Alexander [name] is on no medication.  Lab Results  Component Value Date   ALT 26 05/02/2019   AST 23 05/02/2019   ALKPHOS 65 05/02/2019   BILITOT 1.6 (H) 05/02/2019   Lab Results  Component Value Date   CHOL 190 05/02/2019   HDL 62 05/02/2019   LDLCALC 115 (H) 05/02/2019   TRIG 72 05/02/2019   CHOLHDL 3 12/06/2017   Assessment/Plan:   Other hyperlipidemia. Cardiovascular risk and specific lipid/LDL goals reviewed.  We discussed several lifestyle modifications today and Alexander [name] will continue to work on diet, exercise and weight loss efforts. Orders and follow up as documented in patient record.   Counseling Intensive lifestyle modifications are the first line treatment for this issue. . Dietary changes: Increase soluble fiber. Decrease simple carbohydrates. . Exercise changes: Moderate to vigorous-intensity aerobic activity 150 minutes per week if tolerated. . Lipid-lowering medications: see documented in medical record.  Class 3  severe obesity with serious comorbidity and body mass index (BMI) of 45.0 to 49.9 in adult, unspecified obesity type ([facility]).  Alexander [name] is currently in the action stage of change. As such, his goal is to continue with weight loss efforts. He has agreed to the Category 4 Plan.   Exercise goals: For substantial health benefits, adults should do at least 150 minutes (2 hours and 30 minutes) a week of moderate-intensity, or 75 minutes (1 hour and 15 minutes) a week of vigorous-intensity aerobic physical activity, or an equivalent combination of moderate- and vigorous-intensity aerobic activity. Aerobic activity should be performed in episodes of at least 10 minutes, and preferably, it should be spread throughout the week.  Behavioral modification strategies: decreasing eating out and meal planning and cooking strategies.  Alexander [name] has agreed to follow-up with our clinic in 3 weeks. He was informed of the importance of frequent follow-up visits to maximize his success with intensive lifestyle modifications for his multiple health conditions.   Objective:   Blood pressure (!) 141/73, pulse 93, temperature 98.6 F (37 C), temperature source Oral, height 6\' 2"  (1.88 m), weight (!) 364 lb (165.1 kg), SpO2 97 %. Body mass index is 46.73 kg/m.  General: Cooperative, alert, well developed, in no acute distress. HEENT: Conjunctivae and lids unremarkable. Cardiovascular: Regular rhythm.  Lungs: Normal work of breathing. Neurologic: No focal deficits.   Lab Results  Component Value Date   CREATININE 0.94 05/02/2019   BUN 9 05/02/2019   NA 138 05/02/2019   K 4.6 05/02/2019   CL 101 05/02/2019   CO2 22  05/02/2019   Lab Results  Component Value Date   ALT 26 05/02/2019   AST 23 05/02/2019   ALKPHOS 65 05/02/2019   BILITOT 1.6 (H) 05/02/2019   Lab Results  Component Value Date   HGBA1C 5.4 05/02/2019   HGBA1C 5.5 12/06/2017   Lab Results  Component Value Date   INSULIN 8.1 05/02/2019   Lab  Results  Component Value Date   TSH 1.220 05/02/2019   Lab Results  Component Value Date   CHOL 190 05/02/2019   HDL 62 05/02/2019   LDLCALC 115 (H) 05/02/2019   TRIG 72 05/02/2019   CHOLHDL 3 12/06/2017   Lab Results  Component Value Date   WBC 4.3 05/02/2019   HGB 15.6 05/02/2019   HCT 49.0 05/02/2019   MCV 80 05/02/2019   PLT 210 05/02/2019   No results found for: IRON, TIBC, FERRITIN  Attestation Statements:   Reviewed by clinician on day of visit: allergies, medications, problem list, medical history, surgical history, family history, social history, and previous encounter notes.  Time spent on visit including pre-visit chart review and post-visit charting and care was 30 minutes.   IMichaelene Song, am acting as transcriptionist for Abby Potash, PA-C   I have reviewed the above documentation for accuracy and completeness, and I agree with the above. Abby Potash, PA-C

## 2019-07-20 ENCOUNTER — Telehealth: Payer: Self-pay | Admitting: Internal Medicine

## 2019-07-20 NOTE — Telephone Encounter (Signed)
Patient wanted to verify appointment time, nothing further needed.

## 2019-07-24 ENCOUNTER — Encounter: Payer: Self-pay | Admitting: Internal Medicine

## 2019-07-24 ENCOUNTER — Other Ambulatory Visit: Payer: Self-pay

## 2019-07-24 ENCOUNTER — Ambulatory Visit (INDEPENDENT_AMBULATORY_CARE_PROVIDER_SITE_OTHER): Payer: Managed Care, Other (non HMO) | Admitting: Internal Medicine

## 2019-07-24 DIAGNOSIS — G4733 Obstructive sleep apnea (adult) (pediatric): Secondary | ICD-10-CM

## 2019-07-24 NOTE — Assessment & Plan Note (Addendum)
He benefits from CPAP and reports good compliance. We have reached out to [facility] for download. Consider pressure adjustment and mask refit. If he continues to report active sleep and daytime tiredness, consider in-center study vs trial of stimulant as he works on weight.

## 2019-07-24 NOTE — Patient Instructions (Signed)
Be sure you are getting enough sleep- try for at least an extra half-hour/ night.  Ok to try an occasional otc caffeine tablet like NoDoz  We are requesting your machine settings and download information from [facility], to see if we can make changes n your settings.  Please call as needed

## 2019-07-24 NOTE — Progress Notes (Signed)
07/24/19- 29 yoM former smoker for sleep evaluation. Medical problem list includes Anxiety, OSA, Morbid Obesity, Hyperlipidemia,  HST 11/05/16-AHI 30.3/ hr, desaturation to 74%, body weight 417 lbs Epworth Score Body weight today 369 lbs Download - unavailable CPAP  [employer](recently bought by [employer])  We have requested download. He says machine hasn't seemed as effective since he dropped it, although [employer] checked and said it was ok. Original problem was loud snore with excessive sleepiness.  Has been much better on CPAP but still tired in the day. He doesn't think he snores much now. Feels terrible if he naps without CPAP.  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 Estimates 6 hours sleep /night- we discussed this probably not enough. Restless sleep, without recognized punching or kicking, but has fallen out of bed. Diet program- lost about 70 lbs in past 2 years.  ENT surgery- none. Denies cardiopulmonary disease.  Prior to Admission medications   Medication Sig Start Date End Date Taking? Authorizing Provider  anastrozole (ARIMIDEX) 1 MG tablet TAKE ONE-HALF TABLET BY MOUTH DAILY 04/04/19  Yes McKenzie, Mardene Celeste, MD  hydrOXYzine (ATARAX/VISTARIL) 25 MG tablet Take one 25 mg tablet 30-60 minutes prior to bedtime for insomnia, anxiety. May increase to two tablets. 02/15/19  Yes Jarold Motto, PA  Multiple Vitamins-Minerals (MULTIVITAMIN MEN) TABS Take 1 tablet by mouth daily.   Yes [provider]  Vitamin D, Ergocalciferol, (DRISDOL) 1.25 MG (50000 UNIT) CAPS capsule Take 1 capsule (50,000 Units total) by mouth every 7 (seven) days. 06/27/19  Yes Alois Cliche, PA-C   Past Medical History:  Diagnosis Date  . Anxiety   . Hyperlipidemia   . Hypertension   . Low testosterone   . Obstructive sleep apnea    non compliant see mida    History reviewed. No pertinent surgical history. Family History  Problem Relation Age of Onset  . Obesity Mother   . Heart  disease Maternal Grandfather   . Hypertension Maternal Grandfather    Social History   Socioeconomic History  . Marital status: Single    Spouse name: Not on file  . Number of children: 1  . Years of education: Not on file  . Highest education level: Not on file  Occupational History  . Not on file  Tobacco Use  . Smoking status: Former Smoker    Packs/day: 1.00    Years: 10.00    Pack years: 10.00    Types: Cigarettes    Quit date: 11/16/2017    Years since quitting: 1.6  . Smokeless tobacco: Never Used  Substance and Sexual Activity  . Alcohol use: Yes    Alcohol/week: 3.0 - 4.0 standard drinks    Types: 3 - 4 Cans of beer per week  . Drug use: No  . Sexual activity: Yes  Other Topics Concern  . Not on file  Social History Narrative   [employer]   Daughter, 4 y/o -- sees 3-4 times a week   Social Determinants of Health   Financial Resource Strain:   . Difficulty of Paying Living Expenses:   Food Insecurity:   . Worried About Programme researcher, broadcasting/film/video in the Last Year:   . Barista in the Last Year:   Transportation Needs:   . Freight forwarder (Medical):   Marland Kitchen Lack of Transportation (Non-Medical):   Physical Activity:   . Days of Exercise per Week:   . Minutes of Exercise per Session:   Stress:   .  Feeling of Stress :   Social Connections:   . Frequency of Communication with Friends and Family:   . Frequency of Social Gatherings with Friends and Family:   . Attends Religious Services:   . Active Member of Clubs or Organizations:   . Attends Archivist Meetings:   Marland Kitchen Marital Status:   Intimate Partner Violence:   . Fear of Current or Ex-Partner:   . Emotionally Abused:   Marland Kitchen Physically Abused:   . Sexually Abused:     ROS-see HPI   + = positive Constitutional:    weight loss, night sweats, fevers, chills, fatigue, lassitude. HEENT:    headaches, difficulty swallowing, tooth/dental problems, sore throat,       sneezing,  itching, ear ache, nasal congestion, post nasal drip, snoring CV:    chest pain, orthopnea, PND, swelling in lower extremities, anasarca,                                   dizziness, palpitations Resp:   shortness of breath with exertion or at rest.                productive cough,   non-productive cough, coughing up of blood.              change in color of mucus.  wheezing.   Skin:    rash or lesions. GI:  No-   heartburn, indigestion, abdominal pain, nausea, vomiting, diarrhea,                 change in bowel habits, loss of appetite GU: dysuria, change in color of urine, no urgency or frequency.   flank pain. MS:   joint pain, stiffness, decreased range of motion, back pain. Neuro-     nothing unusual Psych:  change in mood or affect.  depression or +anxiety.   memory loss.  OBJ- Physical Exam General- Alert, Oriented, Affect-appropriate, Distress- none acute, + obese Skin- rash-none, lesions- none, excoriation- none Lymphadenopathy- none Head- atraumatic            Eyes- Gross vision intact, PERRLA, conjunctivae and secretions clear            Ears- Hearing, canals-normal            Nose- Clear, no-Septal dev, mucus, polyps, erosion, perforation             Throat- Mallampati III-IV , mucosa clear , drainage- none, tonsils+ present Neck- flexible , trachea midline, no stridor , thyroid nl, carotid no bruit Chest - symmetrical excursion , unlabored           Heart/CV- RRR , no murmur , no gallop  , no rub, nl s1 s2                           - JVD- none , edema- none, stasis changes- none, varices- none           Lung- clear to P&A, wheeze- none, cough- none , dullness-none, rub- none           Chest wall-  Abd-  Br/ Gen/ Rectal- Not done, not indicated Extrem- cyanosis- none, clubbing, none, atrophy- none, strength- nl Neuro- grossly intact to observation

## 2019-07-24 NOTE — Assessment & Plan Note (Signed)
Strong work on weight loss on his own part. Encouraged to continue.

## 2019-08-03 ENCOUNTER — Ambulatory Visit (INDEPENDENT_AMBULATORY_CARE_PROVIDER_SITE_OTHER): Payer: Managed Care, Other (non HMO) | Admitting: Bariatrics

## 2019-08-03 ENCOUNTER — Encounter (INDEPENDENT_AMBULATORY_CARE_PROVIDER_SITE_OTHER): Payer: Self-pay | Admitting: Bariatrics

## 2019-08-03 ENCOUNTER — Other Ambulatory Visit: Payer: Self-pay

## 2019-08-03 VITALS — BP 121/77 | HR 95 | Temp 98.5°F | Ht 74.0 in | Wt 368.0 lb

## 2019-08-03 DIAGNOSIS — E559 Vitamin D deficiency, unspecified: Secondary | ICD-10-CM | POA: Diagnosis not present

## 2019-08-03 DIAGNOSIS — G4733 Obstructive sleep apnea (adult) (pediatric): Secondary | ICD-10-CM

## 2019-08-03 DIAGNOSIS — Z9189 Other specified personal risk factors, not elsewhere classified: Secondary | ICD-10-CM

## 2019-08-03 DIAGNOSIS — Z6841 Body Mass Index (BMI) 40.0 and over, adult: Secondary | ICD-10-CM

## 2019-08-03 DIAGNOSIS — E66813 Obesity, class 3: Secondary | ICD-10-CM

## 2019-08-03 MED ORDER — VITAMIN D (ERGOCALCIFEROL) 1.25 MG (50000 UNIT) PO CAPS: 50000.0000 [IU] | ORAL_CAPSULE | ORAL | 0 refills | Status: DC

## 2019-08-07 ENCOUNTER — Encounter (INDEPENDENT_AMBULATORY_CARE_PROVIDER_SITE_OTHER): Payer: Self-pay | Admitting: Bariatrics

## 2019-08-07 NOTE — Progress Notes (Signed)
Chief Complaint:   OBESITY Alexander [name] is here to discuss his progress with his obesity treatment plan along with follow-up of his obesity related diagnoses. Alexander [name] is on the Category 4 Plan and states he is following his eating plan approximately 50-60% of the time. Alexander [name] states he is exercising 0 minutes 0 times per week.  Today's visit was #: 7 Starting weight: 384 lbs Starting date: 05/02/2019 Today's weight: 368 lbs Today's date: 08/03/2019 Total lbs lost to date: 16 Total lbs lost since last in-office visit: 0  Interim History: Alexander [name] is up 4 lbs, but has done well overall. He has not been doing his meal preparation.  Subjective:   Vitamin D deficiency. No nausea, vomiting, or muscle weakness. Last Vitamin D was 27.4 on 05/02/2019.  OSA (obstructive sleep apnea). Alexander [name] is using CPAP.  At risk for osteoporosis. Alexander [name] is at higher risk of osteopenia and osteoporosis due to Vitamin D deficiency.   Assessment/Plan:   Vitamin D deficiency. Low Vitamin D level contributes to fatigue and are associated with obesity, breast, and colon cancer. He was given a prescription for Vitamin D, Ergocalciferol, (DRISDOL) 1.25 MG (50000 UNIT) CAPS capsule every week #4 with 0 refills and will follow-up for routine testing of Vitamin D, at least 2-3 times per year to avoid over-replacement.   OSA (obstructive sleep apnea). Intensive lifestyle modifications are the first line treatment for this issue. We discussed several lifestyle modifications today and he will continue to work on diet, exercise and weight loss efforts. We will continue to monitor. Orders and follow up as documented in patient record. Alexander [name] will continue CPAP as directed.  Counseling  Sleep apnea is a condition in which breathing pauses or becomes shallow during sleep. This happens over and over during the night. This disrupts your sleep and keeps your body from getting the rest that it needs, which can cause  tiredness and lack of energy (fatigue) during the day.  Sleep apnea treatment: If you were given a device to open your airway while you sleep, USE IT!  Sleep hygiene:   Limit or avoid alcohol, caffeinated beverages, and cigarettes, especially close to bedtime.   Do not eat a large meal or eat spicy foods right before bedtime. This can lead to digestive discomfort that can make it hard for you to sleep.  Keep a sleep diary to help you and your health care provider figure out what could be causing your insomnia.  . Make your bedroom a dark, comfortable place where it is easy to fall asleep. ? Put up shades or blackout curtains to block light from outside. ? Use a white noise machine to block noise. ? Keep the temperature cool. . Limit screen use before bedtime. This includes: ? Watching TV. ? Using your smartphone, tablet, or computer. . Stick to a routine that includes going to bed and waking up at the same times every day and night. This can help you fall asleep faster. Consider making a quiet activity, such as reading, part of your nighttime routine. . Try to avoid taking naps during the day so that you sleep better at night. . Get out of bed if you are still awake after 15 minutes of trying to sleep. Keep the lights down, but try reading or doing a quiet activity. When you feel sleepy, go back to bed.  At risk for osteoporosis. Alexander [name] was given approximately 15 minutes of osteoporosis prevention counseling today. Alexander [name] is at risk for osteopenia  and osteoporosis due to his Vitamin D deficiency. He was encouraged to take his Vitamin D and follow his higher calcium diet and increase strengthening exercise to help strengthen his bones and decrease his risk of osteopenia and osteoporosis.  Repetitive spaced learning was employed today to elicit superior memory formation and behavioral change.  Class 3 severe obesity with serious comorbidity and body mass index (BMI) of 40.0 to 44.9 in adult,  unspecified obesity type (HCC).  Alexander [name] is currently in the action stage of change. As such, his goal is to continue with weight loss efforts. He has agreed to the Category 4 Plan.    He will work on meal planning, intentional eating, meal preparation the night ahead, and increasing his water intake.  Exercise goals: All adults should avoid inactivity. Some physical activity is better than none, and adults who participate in any amount of physical activity gain some health benefits.  Behavioral modification strategies: increasing lean protein intake, decreasing simple carbohydrates, increasing vegetables, increasing water intake, decreasing eating out, no skipping meals, meal planning and cooking strategies, keeping healthy foods in the home and planning for success.  Alexander [name] has agreed to follow-up with our clinic in 2-3 weeks. He was informed of the importance of frequent follow-up visits to maximize his success with intensive lifestyle modifications for his multiple health conditions.   Objective:   Blood pressure 121/77, pulse 95, temperature 98.5 F (36.9 C), height 6\' 2"  (1.88 m), weight (!) 368 lb (166.9 kg), SpO2 96 %. Body mass index is 47.25 kg/m.  General: Cooperative, alert, well developed, in no acute distress. HEENT: Conjunctivae and lids unremarkable. Cardiovascular: Regular rhythm.  Lungs: Normal work of breathing. Neurologic: No focal deficits.   Lab Results  Component Value Date   CREATININE 0.94 05/02/2019   BUN 9 05/02/2019   NA 138 05/02/2019   K 4.6 05/02/2019   CL 101 05/02/2019   CO2 22 05/02/2019   Lab Results  Component Value Date   ALT 26 05/02/2019   AST 23 05/02/2019   ALKPHOS 65 05/02/2019   BILITOT 1.6 (H) 05/02/2019   Lab Results  Component Value Date   HGBA1C 5.4 05/02/2019   HGBA1C 5.5 12/06/2017   Lab Results  Component Value Date   INSULIN 8.1 05/02/2019   Lab Results  Component Value Date   TSH 1.220 05/02/2019   Lab Results    Component Value Date   CHOL 190 05/02/2019   HDL 62 05/02/2019   LDLCALC 115 (H) 05/02/2019   TRIG 72 05/02/2019   CHOLHDL 3 12/06/2017   Lab Results  Component Value Date   WBC 4.3 05/02/2019   HGB 15.6 05/02/2019   HCT 49.0 05/02/2019   MCV 80 05/02/2019   PLT 210 05/02/2019   No results found for: IRON, TIBC, FERRITIN  Attestation Statements:   Reviewed by clinician on day of visit: allergies, medications, problem list, medical history, surgical history, family history, social history, and previous encounter notes.  05/04/2019, am acting as Fernanda Drum for Energy manager, DO   I have reviewed the above documentation for accuracy and completeness, and I agree with the above. [employer], DO

## 2019-08-17 ENCOUNTER — Ambulatory Visit (INDEPENDENT_AMBULATORY_CARE_PROVIDER_SITE_OTHER): Payer: Managed Care, Other (non HMO) | Admitting: Physician Assistant

## 2019-10-30 ENCOUNTER — Ambulatory Visit: Payer: Managed Care, Other (non HMO) | Admitting: Internal Medicine

## 2019-10-30 NOTE — Progress Notes (Deleted)
07/24/19- 28 yoM former smoker for sleep evaluation. Medical problem list includes Anxiety, OSA, Morbid Obesity, Hyperlipidemia,  HST 11/05/16-AHI 30.3/ hr, desaturation to 74%, body weight 417 lbs Epworth Score Body weight today 369 lbs Download - unavailable CPAP  [employer](recently bought by [employer])  We have requested download. He says machine hasn't seemed as effective since he dropped it, although [employer] checked and said it was ok. Original problem was loud snore with excessive sleepiness.  Has been much better on CPAP but still tired in the day. He doesn't think he snores much now. Feels terrible if he naps without CPAP.  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 Estimates 6 hours sleep /night- we discussed this probably not enough. Restless sleep, without recognized punching or kicking, but has fallen out of bed. Diet program- lost about 70 lbs in past 2 years.  ENT surgery- none. Denies cardiopulmonary disease.  10/30/19- 28 yoM former smoker followed for OSA with EDS [employer] CPAP Auto 5-20  download compliance 86.7%, AHI 1.1/ hr Body weight today- Covid vax-    ROS-see HPI   + = positive Constitutional:    weight loss, night sweats, fevers, chills, fatigue, lassitude. HEENT:    headaches, difficulty swallowing, tooth/dental problems, sore throat,       sneezing, itching, ear ache, nasal congestion, post nasal drip, snoring CV:    chest pain, orthopnea, PND, swelling in lower extremities, anasarca,                                   dizziness, palpitations Resp:   shortness of breath with exertion or at rest.                productive cough,   non-productive cough, coughing up of blood.              change in color of mucus.  wheezing.   Skin:    rash or lesions. GI:  No-   heartburn, indigestion, abdominal pain, nausea, vomiting, diarrhea,                 change in bowel habits, loss of appetite GU: dysuria, change in color of urine, no urgency or frequency.    flank pain. MS:   joint pain, stiffness, decreased range of motion, back pain. Neuro-     nothing unusual Psych:  change in mood or affect.  depression or +anxiety.   memory loss.  OBJ- Physical Exam General- Alert, Oriented, Affect-appropriate, Distress- none acute, + obese Skin- rash-none, lesions- none, excoriation- none Lymphadenopathy- none Head- atraumatic            Eyes- Gross vision intact, PERRLA, conjunctivae and secretions clear            Ears- Hearing, canals-normal            Nose- Clear, no-Septal dev, mucus, polyps, erosion, perforation             Throat- Mallampati III-IV , mucosa clear , drainage- none, tonsils+ present Neck- flexible , trachea midline, no stridor , thyroid nl, carotid no bruit Chest - symmetrical excursion , unlabored           Heart/CV- RRR , no murmur , no gallop  , no rub, nl s1 s2                           -  JVD- none , edema- none, stasis changes- none, varices- none           Lung- clear to P&A, wheeze- none, cough- none , dullness-none, rub- none           Chest wall-  Abd-  Br/ Gen/ Rectal- Not done, not indicated Extrem- cyanosis- none, clubbing, none, atrophy- none, strength- nl Neuro- grossly intact to observation

## 2020-02-20 ENCOUNTER — Encounter (INDEPENDENT_AMBULATORY_CARE_PROVIDER_SITE_OTHER): Payer: Self-pay | Admitting: Family Medicine

## 2020-02-20 ENCOUNTER — Ambulatory Visit (INDEPENDENT_AMBULATORY_CARE_PROVIDER_SITE_OTHER): Payer: Managed Care, Other (non HMO) | Admitting: Family Medicine

## 2020-02-20 ENCOUNTER — Other Ambulatory Visit: Payer: Self-pay

## 2020-02-20 VITALS — BP 150/88 | HR 105 | Temp 98.3°F | Ht 74.0 in | Wt >= 6400 oz

## 2020-02-20 DIAGNOSIS — E88819 Insulin resistance, unspecified: Secondary | ICD-10-CM | POA: Insufficient documentation

## 2020-02-20 DIAGNOSIS — E559 Vitamin D deficiency, unspecified: Secondary | ICD-10-CM | POA: Insufficient documentation

## 2020-02-20 DIAGNOSIS — F430 Acute stress reaction: Secondary | ICD-10-CM

## 2020-02-20 DIAGNOSIS — Z9189 Other specified personal risk factors, not elsewhere classified: Secondary | ICD-10-CM | POA: Diagnosis not present

## 2020-02-20 DIAGNOSIS — E7849 Other hyperlipidemia: Secondary | ICD-10-CM | POA: Diagnosis not present

## 2020-02-20 DIAGNOSIS — E8881 Metabolic syndrome: Secondary | ICD-10-CM

## 2020-02-20 DIAGNOSIS — G4733 Obstructive sleep apnea (adult) (pediatric): Secondary | ICD-10-CM

## 2020-02-20 DIAGNOSIS — Z6841 Body Mass Index (BMI) 40.0 and over, adult: Secondary | ICD-10-CM

## 2020-02-20 MED ORDER — VITAMIN D (ERGOCALCIFEROL) 1.25 MG (50000 UNIT) PO CAPS: 50000.0000 [IU] | ORAL_CAPSULE | ORAL | 0 refills | Status: DC

## 2020-02-22 NOTE — Progress Notes (Signed)
Chief Complaint:   OBESITY Alexander [name] is here to discuss his progress with his obesity treatment plan along with follow-up of his obesity related diagnoses. Alexander [name] is on the Category 4 Plan and states he is following his eating plan approximately 0% of the time. Alexander [name] states he is exercising 0 minutes 0 times per week.  Today's visit was #: 8 Starting weight: 384 lbs Starting date: 05/02/2019 Today's weight: 411 lbs Today's date: 02/20/2020 Total lbs lost to date: +27 lbs Total lbs lost since last in-office visit: +43 lbs Total weight loss percentage to date: 7.03%  Interim History: Alexander [name] started the program 05/02/2019 and was last seen in June 2021 by Alexander [name]. He reports increased stress. His mom was diagnosed with breast cancer,he had another child, and friend was murdered. He notes that stress makes him eat. He has gained 43 lbs since his last OV. He still has papers for Category 4 and spent $500.00 on foods on the plan. Alexander [name] lives with his girlfriend and 33 month old baby boy, and has his 71 year old daughter every other week.  Assessment/Plan:   1. Acute reaction to stress with emotional eating Alexander [name] denies suicidal ideations and feels he doesn't need mood stabilizing medication. He notes it's getting better and easier to manage as of late. He is sleeping well with CPAP every night.  Plan: Alexander [name] declines mood stabilizing medication. I recommend patient pursue CBT through work (EAP) or contact PCP about a referral. Patient is not sure he wants to. Stress management techniques discussed with patient extensively.   - Elevated BP:  Denies h/o htn. Denies Sx.   BP elevated today and past couple of OV's.  Per pt- can be up just at Alexander [name] office visits.   Advised to check at home if possible and keep log, bring in each OV     2. Insulin resistance Alexander [name] has a diagnosis of insulin resistance based on his elevated fasting insulin level >5. He continues to work on diet and exercise to  decrease his risk of diabetes. Discussed labs with patient today.  Lab Results  Component Value Date   INSULIN 8.1 05/02/2019   Lab Results  Component Value Date   HGBA1C 5.4 05/02/2019   Plan: Alexander [name] will continue to work on weight loss, exercise, and decreasing simple carbohydrates to help decrease the risk of diabetes. Alexander [name] to follow-up with [place] as directed to closely monitor his progress. Come in fasting tomorrow for bloodwork. Consider meds in future to help with wt loss  Orders - Hemoglobin A1c - Insulin, random    3. Other hyperlipidemia Discussed labs with patient today. Alexander [name] had an elevated LDL about 6 months ago. He is not on medication.  Lab Results  Component Value Date   CHOL 190 05/02/2019   HDL 62 05/02/2019   LDLCALC 115 (H) 05/02/2019   TRIG 72 05/02/2019   CHOLHDL 3 12/06/2017   Plan:  Orders - Lipid panel - cont meal plan and wt loss / eventually exercise to imporve levels,  - Will cont to monitor     4. Vitamin D deficiency Alexander [name]'s Vitamin D level was 27.4 on 05/02/2019. He is currently taking prescription vitamin D 50,000 IU each week. He denies nausea, vomiting or muscle weakness.   Ref. Range 05/02/2019 14:13  Vitamin D, 25-Hydroxy Latest Ref Range: 30.0 - 100.0 ng/mL 27.4 (L)   Plan: Refill Vit D for 1 month, as per below. Come in tomorrow for fasting bloodwork. Low  Vitamin D level contributes to fatigue and are associated with obesity, breast, and colon cancer. He agrees to continue to take prescription Vitamin D @50 ,000 IU every week and will follow-up for routine testing of Vitamin D, at least 2-3 times per year to avoid over-replacement.  Refill- Vitamin D, Ergocalciferol, (DRISDOL) 1.25 MG (50000 UNIT) CAPS capsule; Take 1 capsule (50,000 Units total) by mouth every 7 (seven) days.  Dispense: 4 capsule; Refill: 0  Orders - VITAMIN D 25 Hydroxy (Vit-D Deficiency, Fractures)     5. OSA (obstructive sleep apnea) Alexander [name] uses a CPAP  machine nightly that was recalled recently. He was last seen by Alexander [name] at pulmonology about 6 months ago.  Plan: Alexander [name] will call the manufacturer regarding the recall and see what he needs to do since his machine was recalled. Importance of nightly use d/c pt    6. At risk for depression Alexander [name] was given approximately 15 minutes of depression risk counseling today. He has risk factors for depression. We discussed the importance of a healthy work life balance, a healthy relationship with food and a good support system.    7. Class 3 severe obesity with serious comorbidity and body mass index (BMI) of 50.0 to 59.9 in adult, unspecified obesity type Alexander [name]) Alexander [name] is currently in the action stage of change. As such, his goal is to continue with weight loss efforts. He has [name] to the Category 4 Plan.   Exercise goals: Walk 15-20 minutes daily for stress relief.  Behavioral modification strategies: increasing lean protein intake, decreasing simple carbohydrates, meal planning and cooking strategies, keeping healthy foods in the home, avoiding temptations and planning for success.    Alexander [name] has [name] to follow-up with our clinic in 2 weeks and come in for fasting blood work tomorrow or next day.   He was informed of the importance of frequent follow-up visits to maximize his success with intensive lifestyle modifications for his multiple health conditions.   Alexander [name] was informed we would discuss his lab results at his next visit unless there is a critical issue that needs to be addressed sooner. Alexander [name] to keep his next visit at the Alexander [name] time to discuss these results.  Objective:   Blood pressure (!) 150/88, pulse (!) 105, temperature 98.3 F (36.8 C), height 6\' 2"  (1.88 m), weight (!) 411 lb (186.4 kg), SpO2 96 %. Body mass index is 52.77 kg/m.  General: Cooperative, alert, well developed, in no acute distress. HEENT: Conjunctivae and lids unremarkable. Cardiovascular: Regular  rhythm.  Lungs: Normal work of breathing. Neurologic: No focal deficits.   Lab Results  Component Value Date   CREATININE 0.94 05/02/2019   BUN 9 05/02/2019   NA 138 05/02/2019   K 4.6 05/02/2019   CL 101 05/02/2019   CO2 22 05/02/2019   Lab Results  Component Value Date   ALT 26 05/02/2019   AST 23 05/02/2019   ALKPHOS 65 05/02/2019   BILITOT 1.6 (H) 05/02/2019   Lab Results  Component Value Date   HGBA1C 5.4 05/02/2019   HGBA1C 5.5 12/06/2017   Lab Results  Component Value Date   INSULIN 8.1 05/02/2019   Lab Results  Component Value Date   TSH 1.220 05/02/2019   Lab Results  Component Value Date   CHOL 190 05/02/2019   HDL 62 05/02/2019   LDLCALC 115 (H) 05/02/2019   TRIG 72 05/02/2019   CHOLHDL 3 12/06/2017   Lab Results  Component Value Date   WBC 4.3 05/02/2019  HGB 15.6 05/02/2019   HCT 49.0 05/02/2019   MCV 80 05/02/2019   PLT 210 05/02/2019    Attestation Statements:   Reviewed by clinician on day of visit: allergies, medications, problem list, medical history, surgical history, family history, social history, and previous encounter notes.  Edmund Hilda, am acting as Energy manager for [employer], DO.  I have reviewed the above documentation for accuracy and completeness, and I agree with the above. Carlye Grippe, D.O.  The 21st Century Cures Act was signed into law in 2016 which includes the topic of electronic health records.  This provides immediate access to information in MyChart.  This includes consultation notes, operative notes, office notes, lab results and pathology reports.  If you have any questions about what you read please let us know at your next visit so we can discuss your concerns and take corrective action if need be.  We are right here with you.

## 2020-02-23 ENCOUNTER — Telehealth: Payer: Self-pay | Admitting: Internal Medicine

## 2020-02-23 LAB — INSULIN, RANDOM: INSULIN: 19.3 u[IU]/mL (ref 2.6–24.9)

## 2020-02-23 LAB — HEMOGLOBIN A1C
Est. average glucose Bld gHb Est-mCnc: 108 mg/dL
Hgb A1c MFr Bld: 5.4 % (ref 4.8–5.6)

## 2020-02-23 LAB — LIPID PANEL
Chol/HDL Ratio: 2.7 ratio (ref 0.0–5.0)
Cholesterol, Total: 209 mg/dL — ABNORMAL HIGH (ref 100–199)
HDL: 78 mg/dL (ref >39)
LDL Chol Calc (NIH): 118 mg/dL — ABNORMAL HIGH (ref 0–99)
Triglycerides: 73 mg/dL (ref 0–149)
VLDL Cholesterol Cal: 13 mg/dL (ref 5–40)

## 2020-02-23 LAB — VITAMIN D 25 HYDROXY (VIT D DEFICIENCY, FRACTURES): Vit D, 25-Hydroxy: 24.4 ng/mL — ABNORMAL LOW (ref 30.0–100.0)

## 2020-02-26 NOTE — Telephone Encounter (Signed)
Spoke with pt and advised him to call [employer], the company that provided him with the CPAP and they will put him on a list for a replacement machine. Pt verbalized understanding. Nothing further needed.

## 2020-03-03 ENCOUNTER — Encounter (INDEPENDENT_AMBULATORY_CARE_PROVIDER_SITE_OTHER): Payer: Self-pay | Admitting: Physician Assistant

## 2020-03-04 ENCOUNTER — Telehealth (INDEPENDENT_AMBULATORY_CARE_PROVIDER_SITE_OTHER): Payer: Managed Care, Other (non HMO) | Admitting: Physician Assistant

## 2020-03-19 ENCOUNTER — Other Ambulatory Visit: Payer: Self-pay

## 2020-03-19 ENCOUNTER — Ambulatory Visit (INDEPENDENT_AMBULATORY_CARE_PROVIDER_SITE_OTHER): Payer: Managed Care, Other (non HMO) | Admitting: Bariatrics

## 2020-03-19 VITALS — BP 138/85 | HR 89 | Temp 98.7°F | Ht 74.0 in | Wt >= 6400 oz

## 2020-03-19 DIAGNOSIS — E8881 Metabolic syndrome: Secondary | ICD-10-CM | POA: Diagnosis not present

## 2020-03-19 DIAGNOSIS — Z9189 Other specified personal risk factors, not elsewhere classified: Secondary | ICD-10-CM

## 2020-03-19 DIAGNOSIS — E559 Vitamin D deficiency, unspecified: Secondary | ICD-10-CM

## 2020-03-19 DIAGNOSIS — Z6841 Body Mass Index (BMI) 40.0 and over, adult: Secondary | ICD-10-CM

## 2020-03-19 DIAGNOSIS — E88819 Insulin resistance, unspecified: Secondary | ICD-10-CM

## 2020-03-19 DIAGNOSIS — E66813 Obesity, class 3: Secondary | ICD-10-CM

## 2020-03-19 MED ORDER — VITAMIN D (ERGOCALCIFEROL) 1.25 MG (50000 UNIT) PO CAPS: 50000.0000 [IU] | ORAL_CAPSULE | ORAL | 0 refills | Status: DC

## 2020-03-20 ENCOUNTER — Encounter (INDEPENDENT_AMBULATORY_CARE_PROVIDER_SITE_OTHER): Payer: Self-pay | Admitting: Bariatrics

## 2020-03-20 NOTE — Progress Notes (Signed)
Chief Complaint:   OBESITY Alexander [name] is here to discuss his progress with his obesity treatment plan along with follow-up of his obesity related diagnoses. Alexander [name] is on the Category 4 Plan and states he is following his eating plan approximately 85-90% of the time. Alexander [name] states he is walking for 30-60 minutes 1-2 times per week.  Today's visit was #: 9 Starting weight: 384 lbs Starting date: 05/02/2019 Today's weight: 402 lbs Today's date: 03/19/2020 Total lbs lost to date: 0 Total lbs lost since last in-office visit: 9 lbs  Interim History: He is down 9 pounds and doing well overall.  He did not start back on the plan immediately.  Subjective:   1. Vitamin D deficiency Alexander [name]'s Vitamin D level was 24.4 on 02/22/2020. He is currently taking prescription vitamin D 50,000 IU each week. He denies nausea, vomiting or muscle weakness.  2. Insulin resistance Alexander [name] has a diagnosis of insulin resistance based on his elevated fasting insulin level >5. He continues to work on diet and exercise to decrease his risk of diabetes.  No medications.  Lab Results  Component Value Date   INSULIN 19.3 02/22/2020   INSULIN 8.1 05/02/2019   Lab Results  Component Value Date   HGBA1C 5.4 02/22/2020   3. At risk for osteoporosis Alexander [name] is at higher risk of osteopenia and osteoporosis due to Vitamin D deficiency.   Assessment/Plan:   1. Vitamin D deficiency Low Vitamin D level contributes to fatigue and are associated with obesity, breast, and colon cancer. He agrees to continue to take prescription Vitamin D @50 ,000 IU every week and will follow-up for routine testing of Vitamin D, at least 2-3 times per year to avoid over-replacement.  - Refill Vitamin D, Ergocalciferol, (DRISDOL) 1.25 MG (50000 UNIT) CAPS capsule; Take 1 capsule (50,000 Units total) by mouth every 7 (seven) days.  Dispense: 4 capsule; Refill: 0  2. Insulin resistance Alexander [name] will continue to work on weight loss, exercise, and  decreasing simple carbohydrates to help decrease the risk of diabetes. Alexander [name] agreed to follow-up with Alexander [name] as directed to closely monitor his progress.  Decrease carbohydrates and increase healthy fats and proteins.  3. At risk for osteoporosis Alexander [name] was given approximately 15 minutes of osteoporosis prevention counseling today. Alexander [name] is at risk for osteopenia and osteoporosis due to his Vitamin D deficiency. He was encouraged to take his Vitamin D and follow his higher calcium diet and increase strengthening exercise to help strengthen his bones and decrease his risk of osteopenia and osteoporosis.  Repetitive spaced learning was employed today to elicit superior memory formation and behavioral change.  4. Class 3 severe obesity with serious comorbidity and body mass index (BMI) of 50.0 to 59.9 in adult, unspecified obesity type [facility])  Alexander [name] is currently in the action stage of change. As such, his goal is to continue with weight loss efforts. He has agreed to the Category 4 Plan.   He will work on meal planning, will remain adherent to the plan, and will increase his water intake.  Recipes II sheet was provided today.  Exercise goals: Will be active outside.  Behavioral modification strategies: increasing lean protein intake, decreasing simple carbohydrates, increasing vegetables, increasing water intake, decreasing eating out, no skipping meals, meal planning and cooking strategies, keeping healthy foods in the home and planning for success.  Alexander [name] has agreed to follow-up with our clinic in 2-3 weeks, with Dr. Joselyn [name]. He was informed of the importance of frequent follow-up visits to maximize  his success with intensive lifestyle modifications for his multiple health conditions.   Objective:   Blood pressure 138/85, pulse 89, temperature 98.7 F (37.1 C), height 6\' 2"  (1.88 m), SpO2 97 %. Body mass index is 52.77 kg/m.  General: Cooperative, alert, well developed, in no acute  distress. HEENT: Conjunctivae and lids unremarkable. Cardiovascular: Regular rhythm.  Lungs: Normal work of breathing. Neurologic: No focal deficits.   Lab Results  Component Value Date   CREATININE 0.94 05/02/2019   BUN 9 05/02/2019   NA 138 05/02/2019   K 4.6 05/02/2019   CL 101 05/02/2019   CO2 22 05/02/2019   Lab Results  Component Value Date   ALT 26 05/02/2019   AST 23 05/02/2019   ALKPHOS 65 05/02/2019   BILITOT 1.6 (H) 05/02/2019   Lab Results  Component Value Date   HGBA1C 5.4 02/22/2020   HGBA1C 5.4 05/02/2019   HGBA1C 5.5 12/06/2017   Lab Results  Component Value Date   INSULIN 19.3 02/22/2020   INSULIN 8.1 05/02/2019   Lab Results  Component Value Date   TSH 1.220 05/02/2019   Lab Results  Component Value Date   CHOL 209 (H) 02/22/2020   HDL 78 02/22/2020   LDLCALC 118 (H) 02/22/2020   TRIG 73 02/22/2020   CHOLHDL 2.7 02/22/2020   Lab Results  Component Value Date   WBC 4.3 05/02/2019   HGB 15.6 05/02/2019   HCT 49.0 05/02/2019   MCV 80 05/02/2019   PLT 210 05/02/2019   Attestation Statements:   Reviewed by clinician on day of visit: allergies, medications, problem list, medical history, surgical history, family history, social history, and previous encounter notes.  I, 05/04/2019, CMA, am acting as Insurance claims handler for Energy manager, DO  I have reviewed the above documentation for accuracy and completeness, and I agree with the above. [employer], DO

## 2020-04-03 ENCOUNTER — Ambulatory Visit (INDEPENDENT_AMBULATORY_CARE_PROVIDER_SITE_OTHER): Payer: Managed Care, Other (non HMO) | Admitting: Family Medicine

## 2020-04-16 ENCOUNTER — Ambulatory Visit (INDEPENDENT_AMBULATORY_CARE_PROVIDER_SITE_OTHER): Payer: Managed Care, Other (non HMO) | Admitting: Family Medicine

## 2020-05-21 ENCOUNTER — Other Ambulatory Visit (INDEPENDENT_AMBULATORY_CARE_PROVIDER_SITE_OTHER): Payer: Self-pay | Admitting: Bariatrics

## 2020-05-21 DIAGNOSIS — E559 Vitamin D deficiency, unspecified: Secondary | ICD-10-CM

## 2020-05-22 NOTE — Telephone Encounter (Signed)
Dr.Brown 

## 2020-05-23 NOTE — Telephone Encounter (Signed)
Pt last seen by Dr. Brown.  

## 2020-05-24 ENCOUNTER — Telehealth: Payer: Self-pay | Admitting: Internal Medicine

## 2020-05-24 NOTE — Telephone Encounter (Signed)
Called and spoke with patient. He stated that he needs to have a copy of his cpap download sent to [facility] in [place]. I attempted to locate patient in [employer] and [employer], no information was found. While on the phone, he said he had called [employer] and they advised him that he needed to bring his SD card to them for a download.   Called [employer] and spoke with [place]. She was able to find the report. She will fax it to our office.   Called patient to let him to know that as soon as the fax has been received, we will fax it for him. He also wishes to have a copy mailed to him. Verified his address.   Checked the fax machine up front, did not see fax. Will await fax.

## 2020-05-28 NOTE — Telephone Encounter (Signed)
Mandi, have you seen a DL come through?

## 2020-05-29 NOTE — Telephone Encounter (Signed)
I have not seen anything I will forward this message to Dr. Maple Hudson to see if he has received it

## 2020-06-03 NOTE — Telephone Encounter (Signed)
Called [employer] and spoke with [place]. Provided her with the information again. She stated that she will fax over a copy of the download to Dr. Roxy Cedar attention to the main fax. Will continue to await fax.

## 2020-06-04 NOTE — Telephone Encounter (Signed)
Download has been located and given to Dr. Maple Hudson. I called [facility] got fax number of [phone]. Download has been faxed to them. ATC patient to let him know mailbox full. Nothing further needed at this time.

## 2020-06-04 NOTE — Telephone Encounter (Addendum)
Will forward to [facility] to keep a look out as the DL was refaxed.

## 2020-06-19 ENCOUNTER — Telehealth: Payer: Self-pay | Admitting: Internal Medicine

## 2020-06-19 NOTE — Telephone Encounter (Signed)
Called and spoke with pt and he stated that he will come by in the morning to pick up the document. This will be left up front for him.

## 2020-06-19 NOTE — Telephone Encounter (Signed)
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 Pt stated that he completed the school today but they stated he cant start bc they never got the form for him to get his medical card and complete his physical. Pls regard [phone]

## 2020-06-19 NOTE — Telephone Encounter (Signed)
Pt stated that he would like to come to the office and pick up the physical copy if possible. Pls regard [phone]

## 2020-06-20 NOTE — Telephone Encounter (Signed)
Spoke with patient who states that he came to pick up his cpap compliance report and took it where it needed to go and was told they couldn't use it because the date was 2021 and not within the last 30 days. Apologized to patient. Told him I would call [employer] to see if they had an up to date report or if he needed to bring in his SD that was mentioned in a previous encounter. Called [employer] and spoke with [place] who advised to have the patient bring in his SD and she would be able to provide him with a download. Called the patient back to let him know what they said she he expressed understanding. Provided him with the address in [facility]. Nothing further needed at this time.

## 2021-01-28 IMAGING — DX DG LUMBAR SPINE COMPLETE 4+V
5 series · 5 of 5 positions shown · non-contrast
Comparison: None.

CLINICAL DATA: Low back pain

EXAM:
LUMBAR SPINE - COMPLETE 4+ VIEW

[lumbar spine ap]
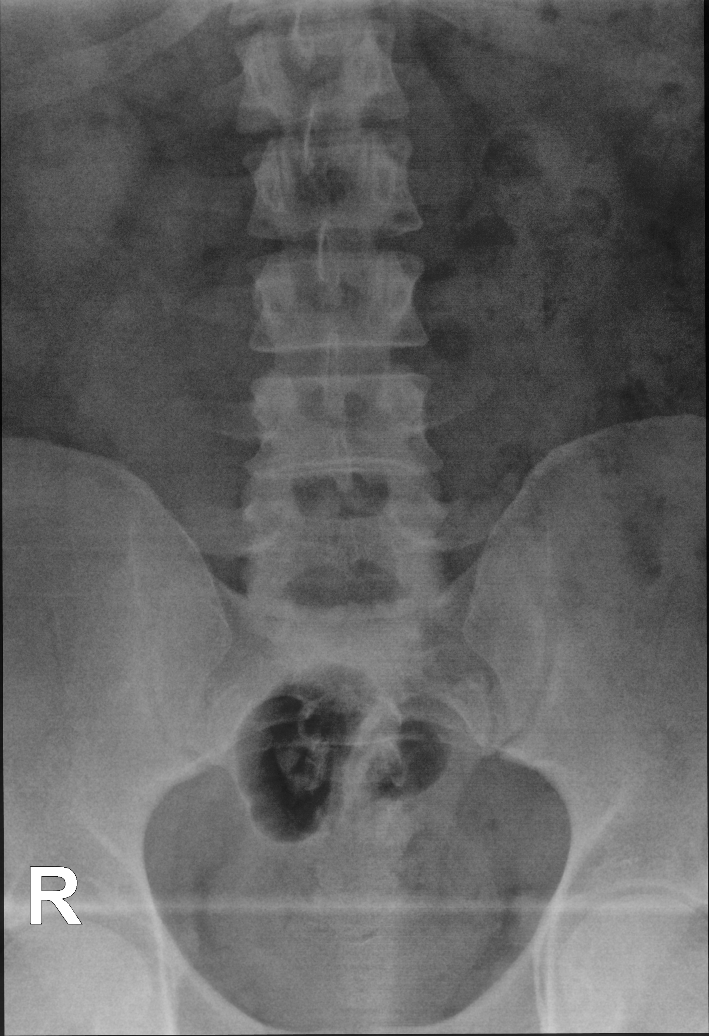

[lumbar spine oblique (1 of 2)]
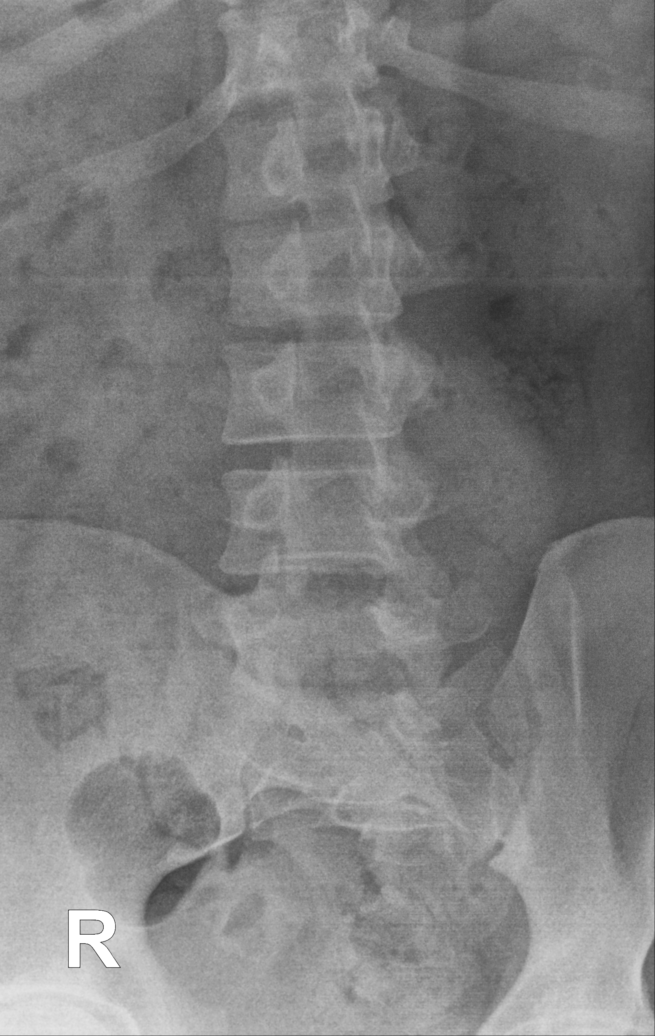

[lumbar spine oblique (2 of 2)]
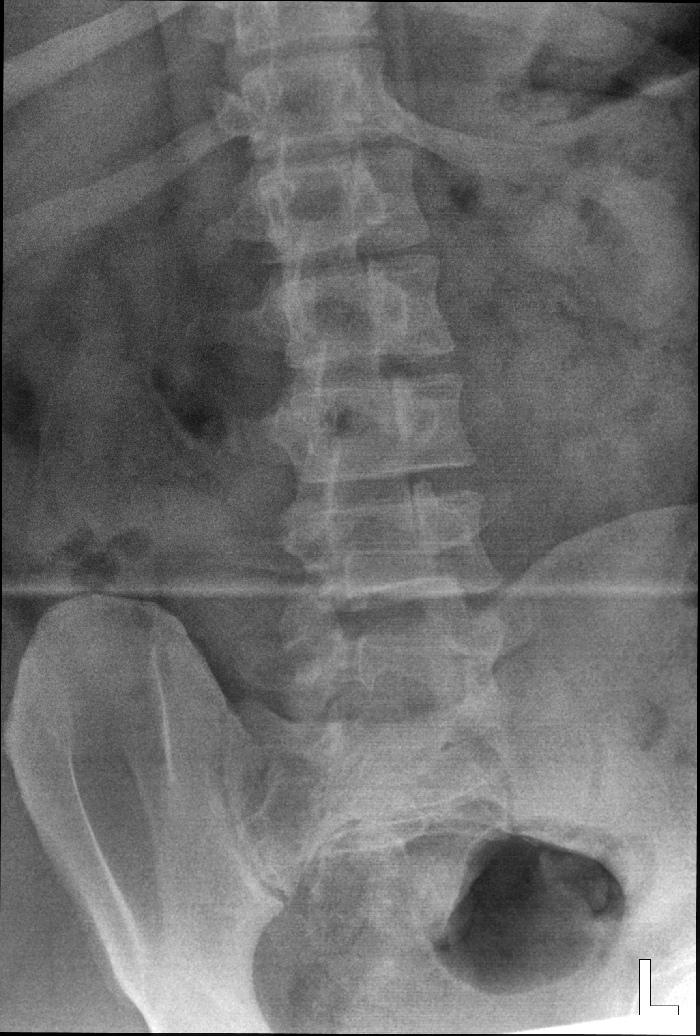

[lumbar spine lat (1 of 2)]
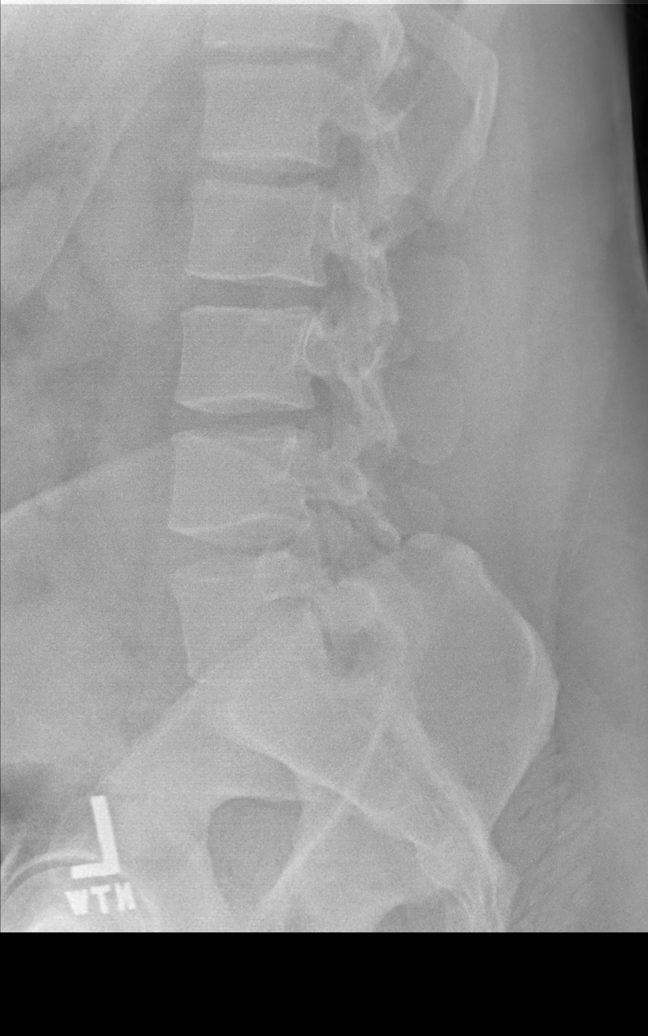

[lumbar spine lat (2 of 2)]
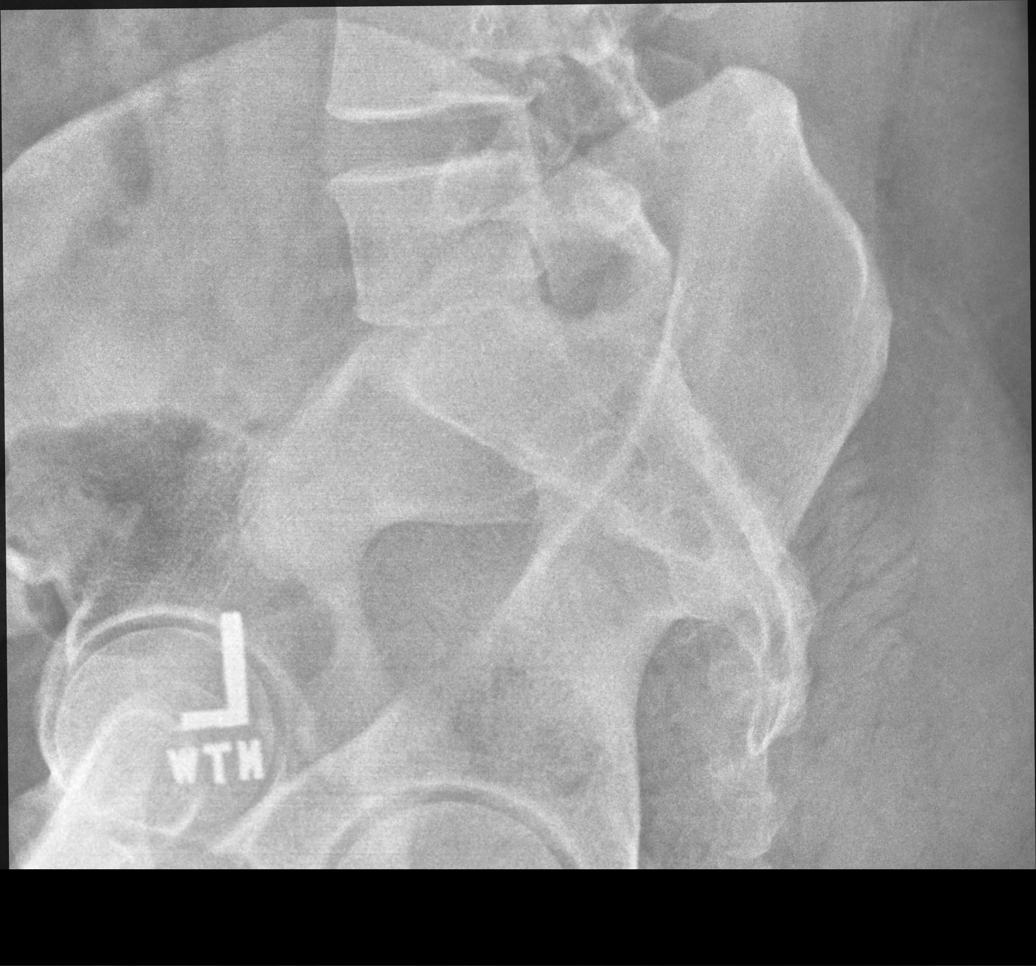

[5 of 5 positions shown; findings below may reference images not displayed]

FINDINGS: There is no evidence of lumbar spine fracture. Alignment is normal.
Degenerative disease with disc height loss at L5-S1. Remainder the
disc spaces are maintained.
IMPRESSION: No acute osseous injury of the lumbar spine.

## 2021-05-20 ENCOUNTER — Ambulatory Visit (INDEPENDENT_AMBULATORY_CARE_PROVIDER_SITE_OTHER): Payer: Managed Care, Other (non HMO) | Admitting: Physician Assistant

## 2021-05-20 ENCOUNTER — Encounter: Payer: Self-pay | Admitting: Physician Assistant

## 2021-05-20 VITALS — BP 152/85 | HR 95 | Temp 98.6°F | Ht 74.0 in | Wt >= 6400 oz

## 2021-05-20 DIAGNOSIS — Z0000 Encounter for general adult medical examination without abnormal findings: Secondary | ICD-10-CM | POA: Diagnosis not present

## 2021-05-20 DIAGNOSIS — E7849 Other hyperlipidemia: Secondary | ICD-10-CM

## 2021-05-20 DIAGNOSIS — R030 Elevated blood-pressure reading, without diagnosis of hypertension: Secondary | ICD-10-CM

## 2021-05-20 DIAGNOSIS — Z302 Encounter for sterilization: Secondary | ICD-10-CM | POA: Diagnosis not present

## 2021-05-20 DIAGNOSIS — E669 Obesity, unspecified: Secondary | ICD-10-CM

## 2021-05-20 DIAGNOSIS — Z1159 Encounter for screening for other viral diseases: Secondary | ICD-10-CM

## 2021-05-20 DIAGNOSIS — G4733 Obstructive sleep apnea (adult) (pediatric): Secondary | ICD-10-CM

## 2021-05-20 NOTE — Patient Instructions (Addendum)
It was great to see you! ? ?Follow-up with me in 1-2 months to recheck your blood pressure ?Let's discuss weight loss medications at this time ? ?Please go to the lab for blood work.  ? ?Our office will call you with your results unless you have chosen to receive results via MyChart. ? ?If your blood work is normal we will follow-up each year for physicals and as scheduled for chronic medical problems. ? ?If anything is abnormal we will treat accordingly and get you in for a follow-up. ? ?Take care, ? ?Alexander [name] ?  ?

## 2021-05-20 NOTE — Progress Notes (Signed)
? ? ?Subjective:  ?  ?Alexander [name] is a 31 y.o. male and is here for a comprehensive physical exam. ? ?HPI ? ?Health Maintenance Due  ?Topic Date Due  ? Hepatitis C Screening  Never done  ? ? ?Acute Concerns: ?Obesity ?Alexander [name] reports he has been struggling with losing weight for the past couple of years. Upon further discussion he admits he has not showed much discipline with his eating by binging cookout and club cocktails 4-5 nights a week. Additionally he is unable to exercise as often due to having a 32 year old and now 59 month old. Although he does know that he needs to work on his diet and exercise, he is interested in trialing a weight loss medication assist him in his weight loss.  ? ?Vasectomy  ?In addition to concerns about his weight, Alexander [name] states he would like to undergo a vasectomy. States that although he is blessed to have his two daughters and son, he is not interested in having anymore children.   ? ?Chronic Issues: ?HLD ?Pt is not on medication for this issue, but is aware that he needs to adjust his diet to improve numbers. Denies CP or SOB.  ? ?OSA ?Pt currently uses his CPAP machine religiously and has found this beneficial in achieving an adequate amount of sleep. Denies concerning sx.  ? ?Elevated BP ?Currently taking no medication. At home blood pressure readings are: not checked. Patient denies chest pain, SOB, blurred vision, dizziness, unusual headaches, lower leg swelling. Denies excessive caffeine intake, stimulant usage, excessive alcohol intake, or increase in salt consumption. ? ?BP Readings from Last 3 Encounters:  ?05/20/21 (!) 152/85  ?03/19/20 138/85  ?02/20/20 (!) 150/88  ? ? ?Health Maintenance: ?Immunizations -- Covid- Due ?Influenza- Due ?Tdap- Due ?Colonoscopy -- N/A ?PSA -- No results found for: PSA1, PSA ?Diet -- Eats all food groups ?Sleep habits -- No concerns ?Exercise -- Not currently ?Weight -- Increased-- see above ?Weight history ?Wt Readings from Last 10  Encounters:  ?05/20/21 (!) 428 lb 12.8 oz (194.5 kg)  ?03/20/20 (!) 402 lb (182.3 kg)  ?02/20/20 (!) 411 lb (186.4 kg)  ?08/03/19 (!) 368 lb (166.9 kg)  ?07/24/19 (!) 369 lb 12.8 oz (167.7 kg)  ?07/13/19 (!) 364 lb (165.1 kg)  ?06/26/19 (!) 361 lb (163.7 kg)  ?06/13/19 (!) 363 lb (164.7 kg)  ?06/01/19 (!) 365 lb (165.6 kg)  ?05/16/19 (!) 371 lb (168.3 kg)  ? ?Body mass index is 55.05 kg/m?. ?Mood -- Stable ?Tobacco use --  ?Tobacco Use: Medium Risk  ? Smoking Tobacco Use: Former  ? Smokeless Tobacco Use: Never  ? Passive Exposure: Not on file  ?  ?Alcohol use ---  reports current alcohol use of about 3.0 - 4.0 standard drinks per week.  ? ? ?  05/02/2019  ? 10:25 AM  ?Depression screen PHQ 2/9  ?Decreased Interest 1  ?Down, Depressed, Hopeless 1  ?PHQ - 2 Score 2  ?Altered sleeping 3  ?Tired, decreased energy 3  ?Change in appetite 1  ?Feeling bad or failure about yourself  1  ?Trouble concentrating 2  ?Moving slowly or fidgety/restless 0  ?Suicidal thoughts 0  ?PHQ-9 Score 12  ?Difficult doing work/chores Not difficult at all  ? ? ? ?Other providers/specialists: ?Patient Care Team: ?Jarold Motto, PA as PCP - General (Physician Assistant) ? ? ?PMHx, SurgHx, SocialHx, Medications, and Allergies were reviewed in the Visit Navigator and updated as appropriate.  ? ?Past Medical History:  ?Diagnosis Date  ?  Anxiety   ? Hyperlipidemia   ? Hypertension   ? Low testosterone   ? Obstructive sleep apnea   ? non compliant see mida   ? ? ?History reviewed. No pertinent surgical history. ? ? ?Family History  ?Problem Relation Age of Onset  ? Obesity Mother   ? Heart disease Maternal Grandfather   ? Hypertension Maternal Grandfather   ? ? ?Social History  ? ?Tobacco Use  ? Smoking status: Former  ?  Packs/day: 1.00  ?  Years: 10.00  ?  Pack years: 10.00  ?  Types: Cigarettes  ?  Quit date: 11/16/2017  ?  Years since quitting: 3.5  ? Smokeless tobacco: Never  ?Vaping Use  ? Vaping Use: Never used  ?Substance Use Topics  ?  Alcohol use: Yes  ?  Alcohol/week: 3.0 - 4.0 standard drinks  ?  Types: 3 - 4 Cans of beer per week  ? Drug use: No  ? ? ?Review of Systems:  ? ?Review of Systems  ?Constitutional:  Negative for chills, fever, malaise/fatigue and weight loss.  ?HENT:  Negative for hearing loss, sinus pain and sore throat.   ?Respiratory:  Negative for cough and hemoptysis.   ?Cardiovascular:  Negative for chest pain, palpitations, leg swelling and PND.  ?Gastrointestinal:  Negative for abdominal pain, constipation, diarrhea, heartburn, nausea and vomiting.  ?Genitourinary:  Negative for dysuria, frequency and urgency.  ?Musculoskeletal:  Negative for back pain, myalgias and neck pain.  ?Skin:  Negative for itching and rash.  ?Neurological:  Negative for dizziness, tingling, seizures and headaches.  ?Endo/Heme/Allergies:  Negative for polydipsia.  ?Psychiatric/Behavioral:  Negative for depression. The patient is not nervous/anxious.   ? ? ?Objective:  ? ?Vitals:  ? 05/20/21 1404  ?BP: (!) 152/85  ?Pulse: 95  ?Temp: 98.6 ?F (37 ?C)  ?SpO2: 95%  ? ?Body mass index is 55.05 kg/m?. ? ?General Appearance:  Alert, cooperative, no distress, appears stated age  ?Head:  Normocephalic, without obvious abnormality, atraumatic  ?Eyes:  PERRL, conjunctiva/corneas clear, EOM's intact, fundi benign, both eyes       ?Ears:  Normal TM's and external ear canals, both ears  ?Nose: Nares normal, septum midline, mucosa normal, no drainage    or sinus tenderness  ?Throat: Lips, mucosa, and tongue normal; teeth and gums normal  ?Neck: Supple, symmetrical, trachea midline, no adenopathy; thyroid:  No enlargement/tenderness/nodules; no carotit bruit or JVD  ?Back:   Symmetric, no curvature, ROM normal, no CVA tenderness  ?Lungs:   Clear to auscultation bilaterally, respirations unlabored  ?Chest wall:  No tenderness or deformity  ?Heart:  Regular rate and rhythm, S1 and S2 normal, no murmur, rub   or gallop  ?Abdomen:   Soft, non-tender, bowel sounds active  all four quadrants, no masses, no organomegaly  ?Extremities: Extremities normal, atraumatic, no cyanosis or edema  ?Prostate: Not done.   ?Skin: Skin color, texture, turgor normal, no rashes or lesions  ?Lymph nodes: Cervical, supraclavicular, and axillary nodes normal  ?Neurologic: CNII-XII grossly intact. Normal strength, sensation and reflexes throughout  ? ? ?Assessment/Plan:  ? ?Routine physical examination ?Today patient counseled on age appropriate routine health concerns for screening and prevention, each reviewed and up to date or declined. Immunizations reviewed and up to date or declined. Labs ordered and reviewed. Risk factors for depression reviewed and negative. Hearing function and visual acuity are intact. ADLs screened and addressed as needed. Functional ability and level of safety reviewed and appropriate. Education, counseling and referrals performed  based on assessed risks today. Patient provided with a copy of personalized plan for preventive services. ? ?Encounter for vasectomy ?Referral for urology placed ? ?Obesity ?Discussed need to reduce fast food ?Continue exercise ?Follow-up in 1 month to discuss possible GLP-1 ? ?Hyperlipidemia ?Update lipid panel/profile, will start medication as indicated by results   ? ?OSA (obstructive sleep apnea) ?Stable  ?Continue use of CPAP machine nightly  ?Management per Dr. Maple Hudson, pulmonology  ? ?Encounter for screening for other viral diseases ?  - Hepatitis C Antibody ? ?Elevated BP without dx of HTN ?Asymptomatic ?Follow-up in 1 month to recheck, sooner if concerns/symptoms ? ? ?Patient Counseling: ?[x]   Nutrition: Stressed importance of moderation in sodium/caffeine intake, saturated fat and cholesterol, caloric balance, sufficient intake of fresh fruits, vegetables, and fiber.  ?[x]   Stressed the importance of regular exercise.   ?[]   Substance Abuse: Discussed cessation/primary prevention of tobacco, alcohol, or other drug use; driving or other  dangerous activities under the influence; availability of treatment for abuse.   ?[x]   Injury prevention: Discussed safety belts, safety helmets, smoke detector, smoking near bedding or upholstery.   ?[]   Sexuality:

## 2021-05-21 LAB — COMPREHENSIVE METABOLIC PANEL
ALT: 22 U/L (ref 0–53)
AST: 21 U/L (ref 0–37)
Albumin: 4.3 g/dL (ref 3.5–5.2)
Alkaline Phosphatase: 53 U/L (ref 39–117)
BUN: 12 mg/dL (ref 6–23)
CO2: 26 mEq/L (ref 19–32)
Calcium: 9.3 mg/dL (ref 8.4–10.5)
Chloride: 101 mEq/L (ref 96–112)
Creatinine, Ser: 0.99 mg/dL (ref 0.40–1.50)
GFR: 100.78 mL/min (ref >60.00)
Glucose, Bld: 91 mg/dL (ref 70–99)
Potassium: 4.3 mEq/L (ref 3.5–5.1)
Sodium: 135 mEq/L (ref 135–145)
Total Bilirubin: 1.3 mg/dL — ABNORMAL HIGH (ref 0.2–1.2)
Total Protein: 7 g/dL (ref 6.0–8.3)

## 2021-05-21 LAB — CBC WITH DIFFERENTIAL/PLATELET
Basophils Absolute: 0.1 10*3/uL (ref 0.0–0.1)
Basophils Relative: 1.1 % (ref 0.0–3.0)
Eosinophils Absolute: 0.1 10*3/uL (ref 0.0–0.7)
Eosinophils Relative: 1.9 % (ref 0.0–5.0)
HCT: 43.1 % (ref 39.0–52.0)
Hemoglobin: 14.1 g/dL (ref 13.0–17.0)
Lymphocytes Relative: 35.8 % (ref 12.0–46.0)
Lymphs Abs: 1.7 10*3/uL (ref 0.7–4.0)
MCHC: 32.8 g/dL (ref 30.0–36.0)
MCV: 79.6 fl (ref 78.0–100.0)
Monocytes Absolute: 0.4 10*3/uL (ref 0.1–1.0)
Monocytes Relative: 9.4 % (ref 3.0–12.0)
Neutro Abs: 2.4 10*3/uL (ref 1.4–7.7)
Neutrophils Relative %: 51.8 % (ref 43.0–77.0)
Platelets: 195 10*3/uL (ref 150.0–400.0)
RBC: 5.41 Mil/uL (ref 4.22–5.81)
RDW: 13.9 % (ref 11.5–15.5)
WBC: 4.7 10*3/uL (ref 4.0–10.5)

## 2021-05-21 LAB — HEPATITIS C ANTIBODY
Hepatitis C Ab: NONREACTIVE
SIGNAL TO CUT-OFF: 0.11 (ref <1.00)

## 2021-05-21 LAB — LIPID PANEL
Cholesterol: 202 mg/dL — ABNORMAL HIGH (ref 0–200)
HDL: 71.8 mg/dL (ref >39.00)
LDL Cholesterol: 108 mg/dL — ABNORMAL HIGH (ref 0–99)
NonHDL: 130.36
Total CHOL/HDL Ratio: 3
Triglycerides: 114 mg/dL (ref 0.0–149.0)
VLDL: 22.8 mg/dL (ref 0.0–40.0)

## 2021-05-21 LAB — HEMOGLOBIN A1C: Hgb A1c MFr Bld: 5.7 % (ref 4.6–6.5)

## 2021-06-19 ENCOUNTER — Ambulatory Visit: Payer: Managed Care, Other (non HMO) | Admitting: Physician Assistant

## 2021-06-19 ENCOUNTER — Encounter: Payer: Self-pay | Admitting: Physician Assistant

## 2021-06-19 DIAGNOSIS — R030 Elevated blood-pressure reading, without diagnosis of hypertension: Secondary | ICD-10-CM

## 2021-06-19 DIAGNOSIS — R17 Unspecified jaundice: Secondary | ICD-10-CM

## 2021-06-19 LAB — HEPATIC FUNCTION PANEL
ALT: 23 U/L (ref 0–53)
AST: 23 U/L (ref 0–37)
Albumin: 4.3 g/dL (ref 3.5–5.2)
Alkaline Phosphatase: 46 U/L (ref 39–117)
Bilirubin, Direct: 0.2 mg/dL (ref 0.0–0.3)
Total Bilirubin: 1 mg/dL (ref 0.2–1.2)
Total Protein: 7.5 g/dL (ref 6.0–8.3)

## 2021-06-19 NOTE — Progress Notes (Signed)
Alexander [name] is a 31 y.o. male here for a follow up of a pre-existing problem. ? ?History of Present Illness:  ? ?Chief Complaint  ?Patient presents with  ? Hypertension  ? Obesity  ? ? ?HPI ? ?Elevated blood pressure reading ?Currently taking no medication. He has not been checking his blood pressure at home. He notes he will be buying blood pressure monitor soon. Patient denies chest pain, SOB, blurred vision, dizziness, unusual headaches, lower leg swelling. Denies excessive caffeine intake, stimulant usage, excessive alcohol intake, or increase in salt consumption. ? ?BP Readings from Last 3 Encounters:  ?06/19/21 136/90  ?05/20/21 (!) 152/85  ?03/19/20 138/85  ?  ?Obesity ?Patient has been struggling to lose weight for the past few years. He has been trying to follow a healthy diet and exercise. He has been trying to cut down on fast food. He has decreased the amount of fast food and alcohol he has been consuming since last seeing me. He has been drinking more water. He is down about 17 pounds since last visit. He is currently working out about 3-4 times a week. He is happy about his weight loss and would not like to start on any medication at this time. He also would like to lose about 30-35 pounds in the next few months.  ? ?Bilirubin elevation ?Elevated at last visit, needs recheck today. Has significantly reduced his alcohol intake since last seeing [place]. ? ? ?Past Medical History:  ?Diagnosis Date  ? Anxiety   ? Hyperlipidemia   ? Hypertension   ? Low testosterone   ? Obstructive sleep apnea   ? non compliant see mida   ? ?  ?Social History  ? ?Tobacco Use  ? Smoking status: Former  ?  Packs/day: 1.00  ?  Years: 10.00  ?  Pack years: 10.00  ?  Types: Cigarettes  ?  Quit date: 11/16/2017  ?  Years since quitting: 3.5  ? Smokeless tobacco: Never  ?Vaping Use  ? Vaping Use: Never used  ?Substance Use Topics  ? Alcohol use: Yes  ?  Alcohol/week: 3.0 - 4.0 standard drinks  ?  Types: 3 - 4 Cans of beer per  week  ? Drug use: No  ? ? ?History reviewed. No pertinent surgical history. ? ?Family History  ?Problem Relation Age of Onset  ? Breast cancer Mother   ?     chemo/radiation 2021  ? Heart disease Maternal Grandfather   ? Hypertension Maternal Grandfather   ? ? ?No Known Allergies ? ?Current Medications:  ? ?Current Outpatient Medications:  ?  Multiple Vitamins-Minerals (MULTIVITAMIN MEN) TABS, Take 1 tablet by mouth daily., Disp: , Rfl:   ? ?Review of Systems:  ? ?ROS ?Negative unless otherwise specified per HPI.  ? ?Vitals:  ? ?Vitals:  ? 06/19/21 1007  ?BP: 136/90  ?Pulse: 83  ?Temp: 98.5 ?F (36.9 ?C)  ?TempSrc: Temporal  ?SpO2: 94%  ?Weight: (!) 411 lb 4 oz (186.5 kg)  ?Height: 6\' 2"  (1.88 m)  ?   ?Body mass index is 52.8 kg/m?. ? ?Physical Exam:  ? ?Physical Exam ?Vitals and nursing note reviewed.  ?Constitutional:   ?   General: He is not in acute distress. ?   Appearance: He is well-developed. He is not ill-appearing or toxic-appearing.  ?Cardiovascular:  ?   Rate and Rhythm: Normal rate and regular rhythm.  ?   Pulses: Normal pulses.  ?   Heart sounds: Normal heart sounds, S1 normal and  S2 normal.  ?Pulmonary:  ?   Effort: Pulmonary effort is normal.  ?   Breath sounds: Normal breath sounds.  ?Skin: ?   General: Skin is warm and dry.  ?Neurological:  ?   Mental Status: He is alert.  ?   GCS: GCS eye subscore is 4. GCS verbal subscore is 5. GCS motor subscore is 6.  ?Psychiatric:     ?   Speech: Speech normal.     ?   Behavior: Behavior normal. Behavior is cooperative.  ? ? ? ?Assessment and Plan:  ? ?Morbid obesity ([place]) ?Making good progress ?Declines medications ?Follow-up in 2 months ? ?Elevated bilirubin ?Recheck and provide recommendations accordingly ? ?Elevated blood pressure reading ?Improving ?Continue to monitor -- BP cuff script given ?Follow-up in 2 months ? ?I,Savera Zaman,acting as a Education administrator for [employer], PA.,have documented all relevant documentation on the behalf of Inda Coke,  PA,as directed by  Inda Coke, PA while in the presence of Inda Coke, [place].  ? ?IInda Coke, PA, have reviewed all documentation for this visit. The documentation on 06/19/21 for the exam, diagnosis, procedures, and orders are all accurate and complete. ? ?Inda Coke, PA-C ? ?

## 2021-06-19 NOTE — Patient Instructions (Addendum)
It was great to see you! ? ?Buy a blood pressure cuff and keep record for [place] -- check a few times a week for [place] ? ?Keep up the good work ? ?Follow-up in mid July -- goal is to be around 375-380 lb ? ?Take care, ? ?Jarold Motto PA-C  ?

## 2021-09-01 ENCOUNTER — Ambulatory Visit: Payer: Managed Care, Other (non HMO) | Admitting: Physician Assistant

## 2021-09-01 NOTE — Progress Notes (Incomplete)
Alexander [name] is a 31 y.o. male here for a follow up on obesity.   SCRIBE STATEMENT  History of Present Illness:   No chief complaint on file.   HPI Obesity   Elevated Blood Pressure Readings  Past Medical History:  Diagnosis Date   Anxiety    Hyperlipidemia    Hypertension    Low testosterone    Obstructive sleep apnea    non compliant see mida      Social History   Tobacco Use   Smoking status: Former    Packs/day: 1.00    Years: 10.00    Total pack years: 10.00    Types: Cigarettes    Quit date: 11/16/2017    Years since quitting: 3.7   Smokeless tobacco: Never  Vaping Use   Vaping Use: Never used  Substance Use Topics   Alcohol use: Yes    Alcohol/week: 3.0 - 4.0 standard drinks of alcohol    Types: 3 - 4 Cans of beer per week   Drug use: No    No past surgical history on file.  Family History  Problem Relation Age of Onset   Breast cancer Mother        chemo/radiation 2021   Heart disease Maternal Grandfather    Hypertension Maternal Grandfather     No Known Allergies  Current Medications:   Current Outpatient Medications:    Multiple Vitamins-Minerals (MULTIVITAMIN MEN) TABS, Take 1 tablet by mouth daily., Disp: , Rfl:    Review of Systems:   ROS Negative unless otherwise specified per HPI.   Vitals:   There were no vitals filed for this visit.   There is no height or weight on file to calculate BMI.  Physical Exam:   Physical Exam  Assessment and Plan:   @DIAGLIST @    I,Savera Zaman,acting as a scribe for , PA.,have documented all relevant documentation on the behalf of [employer], PA,as directed by  Jarold Motto, PA while in the presence of Jarold Motto, Jarold Motto.   ***  [place], PA-C

## 2021-09-24 ENCOUNTER — Encounter (INDEPENDENT_AMBULATORY_CARE_PROVIDER_SITE_OTHER): Payer: Self-pay

## 2022-10-02 ENCOUNTER — Telehealth: Payer: Self-pay | Admitting: Physician Assistant

## 2022-10-02 NOTE — Telephone Encounter (Signed)
Pt needs a new order for a sleep apnea machine. His broke and he needs a new one. Please advise.

## 2022-10-02 NOTE — Telephone Encounter (Signed)
Spoke to pt told him he will need to contact Pulmonary since they ordered the machine. Pt verbalized understanding.

## 2022-10-07 ENCOUNTER — Encounter: Payer: Self-pay | Admitting: Primary Care

## 2022-10-07 ENCOUNTER — Ambulatory Visit (INDEPENDENT_AMBULATORY_CARE_PROVIDER_SITE_OTHER): Payer: Managed Care, Other (non HMO) | Admitting: Primary Care

## 2022-10-07 VITALS — BP 138/84 | HR 98 | Temp 98.3°F | Ht 73.0 in | Wt >= 6400 oz

## 2022-10-07 DIAGNOSIS — G4733 Obstructive sleep apnea (adult) (pediatric): Secondary | ICD-10-CM | POA: Diagnosis not present

## 2022-10-07 NOTE — Patient Instructions (Addendum)
Nice meeting you today, thank you for bring in download from your CPAP You are compliant with use and pressure settings look good We will placed an order for you to receive a new CPAP machine Make sure they register you for [facility]/ you can also download APP called MyAir  Orders: New CPAP 5-20cm h20  Follow-up: 3 months with Beth NP for compliance check (can be virtual if enrolled in [place] program)  CPAP and BIPAP Information CPAP and BIPAP are methods that use air pressure to keep your airways open and to help you breathe well. CPAP and BIPAP use different amounts of pressure. Your health care provider will tell you whether CPAP or BIPAP would be more helpful for you. CPAP stands for "continuous positive airway pressure." With CPAP, the amount of pressure stays the same while you breathe in (inhale) and out (exhale). BIPAP stands for "bi-level positive airway pressure." With BIPAP, the amount of pressure will be higher when you inhale and lower when you exhale. This allows you to take larger breaths. CPAP or BIPAP may be used in the hospital, or your health care provider may want you to use it at home. You may need to have a sleep study before your health care provider can order a machine for you to use at home. What are the advantages? CPAP or BIPAP can be helpful if you have: Sleep apnea. Chronic obstructive pulmonary disease (COPD). Heart failure. Medical conditions that cause muscle weakness, including muscular dystrophy or amyotrophic lateral sclerosis (ALS). Other problems that cause breathing to be shallow, weak, abnormal, or difficult. CPAP and BIPAP are most commonly used for obstructive sleep apnea (OSA) to keep the airways from collapsing when the muscles relax during sleep. What are the risks? Generally, this is a safe treatment. However, problems may occur, including: Irritated skin or skin sores if the mask does not fit properly. Dry or stuffy nose or nosebleeds. Dry  mouth. Feeling gassy or bloated. Sinus or lung infection if the equipment is not cleaned properly. When should CPAP or BIPAP be used? In most cases, the mask only needs to be worn during sleep. Generally, the mask needs to be worn throughout the night and during any daytime naps. People with certain medical conditions may also need to wear the mask at other times, such as when they are awake. Follow instructions from your health care provider about when to use the machine. What happens during CPAP or BIPAP?  Both CPAP and BIPAP are provided by a small machine with a flexible plastic tube that attaches to a plastic mask that you wear. Air is blown through the mask into your nose or mouth. The amount of pressure that is used to blow the air can be adjusted on the machine. Your health care provider will set the pressure setting and help you find the best mask for you. Tips for using the mask Because the mask needs to be snug, some people feel trapped or closed-in (claustrophobic) when first using the mask. If you feel this way, you may need to get used to the mask. One way to do this is to hold the mask loosely over your nose or mouth and then gradually apply the mask more snugly. You can also gradually increase the amount of time that you use the mask. Masks are available in various types and sizes. If your mask does not fit well, talk with your health care provider about getting a different one. Some common types of masks include:  Full face masks, which fit over the mouth and nose. Nasal masks, which fit over the nose. Nasal pillow or prong masks, which fit into the nostrils. If you are using a mask that fits over your nose and you tend to breathe through your mouth, a chin strap may be applied to help keep your mouth closed. Use a skin barrier to protect your skin as told by your health care provider. Some CPAP and BIPAP machines have alarms that may sound if the mask comes off or develops a  leak. If you have trouble with the mask, it is very important that you talk with your health care provider about finding a way to make the mask easier to tolerate. Do not stop using the mask. There could be a negative impact on your health if you stop using the mask. Tips for using the machine Place your CPAP or BIPAP machine on a secure table or stand near an electrical outlet. Know where the on/off switch is on the machine. Follow instructions from your health care provider about how to set the pressure on your machine and when you should use it. Do not eat or drink while the CPAP or BIPAP machine is on. Food or fluids could get pushed into your lungs by the pressure of the CPAP or BIPAP. For home use, CPAP and BIPAP machines can be rented or purchased through home health care companies. Many different brands of machines are available. Renting a machine before purchasing may help you find out which particular machine works well for you. Your health insurance company may also decide which machine you may get. Keep the CPAP or BIPAP machine and attachments clean. Ask your health care provider for specific instructions. Check the humidifier if you have a dry stuffy nose or nosebleeds. Make sure it is working correctly. Follow these instructions at home: Take over-the-counter and prescription medicines only as told by your health care provider. Ask if you can take sinus medicine if your sinuses are blocked. Do not use any products that contain nicotine or tobacco. These products include cigarettes, chewing tobacco, and vaping devices, such as e-cigarettes. If you need help quitting, ask your health care provider. Keep all follow-up visits. This is important. Contact a health care provider if: You have redness or pressure sores on your head, face, mouth, or nose from the mask or head gear. You have trouble using the CPAP or BIPAP machine. You cannot tolerate wearing the CPAP or BIPAP mask. Someone  tells you that you snore even when wearing your CPAP or BIPAP. Get help right away if: You have trouble breathing. You feel confused. Summary CPAP and BIPAP are methods that use air pressure to keep your airways open and to help you breathe well. If you have trouble with the mask, it is very important that you talk with your health care provider about finding a way to make the mask easier to tolerate. Do not stop using the mask. There could be a negative impact to your health if you stop using the mask. Follow instructions from your health care provider about when to use the machine. This information is not intended to replace advice given to you by your health care provider. Make sure you discuss any questions you have with your health care provider. Document Revised: 09/11/2020 Document Reviewed: 01/12/2020 Elsevier Patient Education  2023 [employer].

## 2022-10-07 NOTE — Assessment & Plan Note (Signed)
-   Patient is 97% compliant with CPAP use > 4 hours over the last 30 days.  Reports benefit in sleep quality and fatigue with use. Current CPAP machine is greater than 34 years old and stopped working 10 days ago. Pressure auto setting 4 - 20 cm H2O; Residual AHI 1.0/hour.  DME order placed for patient to receive new CPAP machine through [facility]. No changes to pressure settings today.  Advised patient aim to wear CPAP nightly for 6 hours or longer.    Follow-up in 31 to 90 days for CPAP compliance check.

## 2022-10-07 NOTE — Progress Notes (Signed)
@Patient  ID: Alexander [name], male    DOB: 1990/10/26, 32 y.o.   MRN: [id number]  Chief Complaint  Patient presents with   Follow-up    Needs new CPAP.    Referring provider: Jarold Motto, PA  HPI: 32 year old male, former smoker quit in 2019.  Past medical history significant for sleep apnea.  Patient of Dr. Maple Hudson, last seen in 2021.  10/07/2022 Patient presents today for overdue follow-up for OSA. Home sleep study in September 2018 showed severe obstructive sleep apnea, AHI 30.3 an hour with SpO2 low 74% (body weight 417 pounds).  Patient has been maintained on auto CPAP for the last 6 years.  Patient has a DreamStation 2 auto CPAP, machine stopped working about 10 days ago.  Prior to this he was 100% compliant with use and reported benefit from wearing.  Since machine stopped working he has not been sleeping well, snoring more.  His partner has been having to sleep in a separate bedroom.  He does dose off easily if inactive.  He does operate heavy machinery. He has not fallen asleep while driving. He had a DOT physical 2 months ago.  DME company is [facility].  [facility] summary report 06/26/2022 - 09/26/2022 Average usage 100% (97% > 4 hours) Average usage 6 hours 17 minutes Pressure 4 to 20 cm H2O (12.8 cm H2O-95%) AHI 1     No Known Allergies   There is no immunization history on file for this patient.  Past Medical History:  Diagnosis Date   Anxiety    Hyperlipidemia    Hypertension    Low testosterone    Obstructive sleep apnea    non compliant see mida     Tobacco History: Social History   Tobacco Use  Smoking Status Former   Current packs/day: 0.00   Average packs/day: 1 pack/day for 10.0 years (10.0 ttl pk-yrs)   Types: Cigarettes   Start date: 11/17/2007   Quit date: 11/16/2017   Years since quitting: 4.8  Smokeless Tobacco Never   Counseling given: Not Answered   Outpatient Medications Prior to Visit  Medication Sig Dispense Refill   Multiple  Vitamins-Minerals (MULTIVITAMIN MEN) TABS Take 1 tablet by mouth daily.     No facility-administered medications prior to visit.   Review of Systems  Review of Systems  Constitutional:  Positive for fatigue.  HENT: Negative.    Respiratory: Negative.    Cardiovascular: Negative.    Physical Exam  BP 138/84 (BP Location: Left Arm, Patient Position: Sitting, Cuff Size: Large)   Pulse 98   Temp 98.3 F (36.8 C) (Oral)   Ht 6\' 1"  (1.854 m)   Wt (!) 408 lb (185.1 kg)   SpO2 96%   BMI 53.83 kg/m  Physical Exam Constitutional:      Appearance: Normal appearance. He is obese.  HENT:     Head: Normocephalic and atraumatic.     Mouth/Throat:     Mouth: Mucous membranes are moist.     Pharynx: Oropharynx is clear.  Cardiovascular:     Rate and Rhythm: Normal rate and regular rhythm.  Pulmonary:     Effort: Pulmonary effort is normal.     Breath sounds: Normal breath sounds.  Musculoskeletal:        General: Normal range of motion.  Skin:    General: Skin is warm and dry.  Neurological:     General: No focal deficit present.     Mental Status: He is alert and oriented to person,  place, and time. Mental status is at baseline.  Psychiatric:        Mood and Affect: Mood normal.        Behavior: Behavior normal.        Thought Content: Thought content normal.        Judgment: Judgment normal.      Lab Results:  CBC    Component Value Date/Time   WBC 4.7 05/20/2021 1501   RBC 5.41 05/20/2021 1501   HGB 14.1 05/20/2021 1501   HGB 15.6 05/02/2019 1413   HCT 43.1 05/20/2021 1501   HCT 49.0 05/02/2019 1413   PLT 195.0 05/20/2021 1501   PLT 210 05/02/2019 1413   MCV 79.6 05/20/2021 1501   MCV 80 05/02/2019 1413   MCH 25.5 (L) 05/02/2019 1413   MCH 25.6 (L) 07/01/2016 1306   MCHC 32.8 05/20/2021 1501   RDW 13.9 05/20/2021 1501   RDW 14.6 05/02/2019 1413   LYMPHSABS 1.7 05/20/2021 1501   LYMPHSABS 1.5 05/02/2019 1413   MONOABS 0.4 05/20/2021 1501   EOSABS 0.1  05/20/2021 1501   EOSABS 0.1 05/02/2019 1413   BASOSABS 0.1 05/20/2021 1501   BASOSABS 0.0 05/02/2019 1413    BMET    Component Value Date/Time   NA 135 05/20/2021 1501   NA 138 05/02/2019 1413   K 4.3 05/20/2021 1501   CL 101 05/20/2021 1501   CO2 26 05/20/2021 1501   GLUCOSE 91 05/20/2021 1501   BUN 12 05/20/2021 1501   BUN 9 05/02/2019 1413   CREATININE 0.99 05/20/2021 1501   CALCIUM 9.3 05/20/2021 1501   GFRNONAA 108 05/02/2019 1413   GFRAA 125 05/02/2019 1413    BNP No results found for: "BNP"  ProBNP No results found for: "PROBNP"  Imaging: No results found.   Assessment & Plan:   OSA (obstructive sleep apnea) - Patient is 97% compliant with CPAP use > 4 hours over the last 30 days.  Reports benefit in sleep quality and fatigue with use. Current CPAP machine is greater than 51 years old and stopped working 10 days ago. Pressure auto setting 4 - 20 cm H2O; Residual AHI 1.0/hour.  DME order placed for patient to receive new CPAP machine through [facility]. No changes to pressure settings today.  Advised patient aim to wear CPAP nightly for 6 hours or longer.    Follow-up in 31 to 90 days for CPAP compliance check.    Glenford Bayley, NP 10/07/2022

## 2022-11-17 ENCOUNTER — Encounter: Payer: Self-pay | Admitting: Physician Assistant

## 2022-11-17 ENCOUNTER — Ambulatory Visit (INDEPENDENT_AMBULATORY_CARE_PROVIDER_SITE_OTHER): Payer: Managed Care, Other (non HMO) | Admitting: Physician Assistant

## 2022-11-17 VITALS — BP 134/82 | HR 96 | Temp 97.8°F | Ht 73.0 in | Wt >= 6400 oz

## 2022-11-17 DIAGNOSIS — E88819 Insulin resistance, unspecified: Secondary | ICD-10-CM | POA: Diagnosis not present

## 2022-11-17 DIAGNOSIS — R2242 Localized swelling, mass and lump, left lower limb: Secondary | ICD-10-CM

## 2022-11-17 DIAGNOSIS — R030 Elevated blood-pressure reading, without diagnosis of hypertension: Secondary | ICD-10-CM

## 2022-11-17 DIAGNOSIS — Z0001 Encounter for general adult medical examination with abnormal findings: Secondary | ICD-10-CM | POA: Diagnosis not present

## 2022-11-17 DIAGNOSIS — F419 Anxiety disorder, unspecified: Secondary | ICD-10-CM

## 2022-11-17 DIAGNOSIS — R319 Hematuria, unspecified: Secondary | ICD-10-CM | POA: Diagnosis not present

## 2022-11-17 NOTE — Patient Instructions (Addendum)
It was great to see you!  We will place referral for ultrasound   Follow-up next month to check in on your mental health and weight  Please go to the lab for blood work.   Our office will call you with your results unless you have chosen to receive results via MyChart.  If your blood work is normal we will follow-up each year for physicals and as scheduled for chronic medical problems.  If anything is abnormal we will treat accordingly and get you in for a follow-up.  Take care,  Lelon Mast
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
## 2022-11-18 ENCOUNTER — Ambulatory Visit (HOSPITAL_COMMUNITY)
Admission: RE | Admit: 2022-11-18 | Discharge: 2022-11-18 | Disposition: A | Discharge status: Home / Self Care | Payer: Managed Care, Other (non HMO) | Source: Ambulatory Visit | Attending: Physician Assistant | Admitting: Physician Assistant

## 2022-11-18 DIAGNOSIS — R2242 Localized swelling, mass and lump, left lower limb: Secondary | ICD-10-CM | POA: Insufficient documentation

## 2022-11-18 LAB — COMPREHENSIVE METABOLIC PANEL
ALT: 21 U/L (ref 0–53)
AST: 19 U/L (ref 0–37)
Albumin: 4.3 g/dL (ref 3.5–5.2)
Alkaline Phosphatase: 51 U/L (ref 39–117)
BUN: 14 mg/dL (ref 6–23)
CO2: 30 meq/L (ref 19–32)
Calcium: 9.4 mg/dL (ref 8.4–10.5)
Chloride: 102 meq/L (ref 96–112)
Creatinine, Ser: 1.05 mg/dL (ref 0.40–1.50)
GFR: 92.93 mL/min (ref >60.00)
Glucose, Bld: 86 mg/dL (ref 70–99)
Potassium: 4.2 meq/L (ref 3.5–5.1)
Sodium: 138 meq/L (ref 135–145)
Total Bilirubin: 1.2 mg/dL (ref 0.2–1.2)
Total Protein: 7.5 g/dL (ref 6.0–8.3)

## 2022-11-18 LAB — HEMOGLOBIN A1C: Hgb A1c MFr Bld: 5.6 % (ref 4.6–6.5)

## 2022-11-18 LAB — CBC WITH DIFFERENTIAL/PLATELET
Basophils Absolute: 0 10*3/uL (ref 0.0–0.1)
Basophils Relative: 1 % (ref 0.0–3.0)
Eosinophils Absolute: 0.1 10*3/uL (ref 0.0–0.7)
Eosinophils Relative: 1.4 % (ref 0.0–5.0)
HCT: 44.5 % (ref 39.0–52.0)
Hemoglobin: 14.4 g/dL (ref 13.0–17.0)
Lymphocytes Relative: 39.8 % (ref 12.0–46.0)
Lymphs Abs: 1.8 10*3/uL (ref 0.7–4.0)
MCHC: 32.2 g/dL (ref 30.0–36.0)
MCV: 80.1 fL (ref 78.0–100.0)
Monocytes Absolute: 0.6 10*3/uL (ref 0.1–1.0)
Monocytes Relative: 12 % (ref 3.0–12.0)
Neutro Abs: 2.1 10*3/uL (ref 1.4–7.7)
Neutrophils Relative %: 45.8 % (ref 43.0–77.0)
Platelets: 211 10*3/uL (ref 150.0–400.0)
RBC: 5.56 Mil/uL (ref 4.22–5.81)
RDW: 14.9 % (ref 11.5–15.5)
WBC: 4.6 10*3/uL (ref 4.0–10.5)

## 2022-11-18 LAB — URINALYSIS, ROUTINE W REFLEX MICROSCOPIC
Bilirubin Urine: NEGATIVE
Ketones, ur: NEGATIVE
Leukocytes,Ua: NEGATIVE
Nitrite: NEGATIVE
Specific Gravity, Urine: >=1.03 — AB (ref 1.000–1.030)
Total Protein, Urine: 100 — AB
Urine Glucose: NEGATIVE
Urobilinogen, UA: 0.2 (ref 0.0–1.0)
pH: 6 (ref 5.0–8.0)

## 2022-11-18 LAB — LIPID PANEL
Cholesterol: 196 mg/dL (ref 0–200)
HDL: 65 mg/dL (ref >39.00)
LDL Cholesterol: 116 mg/dL — ABNORMAL HIGH (ref 0–99)
NonHDL: 131.07
Total CHOL/HDL Ratio: 3
Triglycerides: 74 mg/dL (ref 0.0–149.0)
VLDL: 14.8 mg/dL (ref 0.0–40.0)

## 2022-12-23 NOTE — Progress Notes (Shared)
Alexander [name] is a 32 y.o. male here for a follow up of a pre-existing problem.  History of Present Illness:   No chief complaint on file.   HPI  Hematuria  Localized swelling of left lower leg   Morbid Obesity / Insulin Resistance  Anxiety   Past Medical History:  Diagnosis Date   Anxiety    Hyperlipidemia    Hypertension    Low testosterone    Obstructive sleep apnea    non compliant see mida      Social History   Tobacco Use   Smoking status: Former    Current packs/day: 0.00    Average packs/day: 1 pack/day for 10.0 years (10.0 ttl pk-yrs)    Types: Cigarettes    Start date: 11/17/2007    Quit date: 11/16/2017    Years since quitting: 5.1   Smokeless tobacco: Never  Vaping Use   Vaping status: Never Used  Substance Use Topics   Alcohol use: Yes    Alcohol/week: 3.0 - 4.0 standard drinks of alcohol    Types: 3 - 4 Cans of beer per week   Drug use: No    No past surgical history on file.  Family History  Problem Relation Age of Onset   Breast cancer Mother        chemo/radiation 2021   Heart disease Maternal Grandfather    Hypertension Maternal Grandfather     No Known Allergies  Current Medications:   Current Outpatient Medications:    Multiple Vitamins-Minerals (MULTIVITAMIN MEN) TABS, Take 1 tablet by mouth daily., Disp: , Rfl:    Review of Systems:   ROS  Vitals:   There were no vitals filed for this visit.   There is no height or weight on file to calculate BMI.  Physical Exam:   Physical Exam  Assessment and Plan:   ***   I,Alexander Ruley,acting as a scribe for Jarold Motto, PA.,have documented all relevant documentation on the behalf of Jarold Motto, PA,as directed by  Jarold Motto, PA while in the presence of Jarold Motto, [place].   ***   Jarold Motto, PA-C

## 2022-12-30 ENCOUNTER — Ambulatory Visit: Payer: Managed Care, Other (non HMO) | Admitting: Physician Assistant

## 2023-01-06 NOTE — Progress Notes (Unsigned)
Virtual Visit via Video Note  I connected with Alexander [name] on 01/06/23 at  8:30 AM EST by a video enabled telemedicine application and verified that I am speaking with the correct person using two identifiers.  Location: Patient: *** Provider: ***   I discussed the limitations of evaluation and management by telemedicine and the availability of in person appointments. The patient expressed understanding and agreed to proceed.  History of Present Illness:  32 year old male, former smoker quit in 2019.  Past medical history significant for sleep apnea.  Patient of Dr. Maple Hudson, last seen in 2021.  10/07/2022 Patient presents today for overdue follow-up for OSA. Home sleep study in September 2018 showed severe obstructive sleep apnea, AHI 30.3 an hour with SpO2 low 74% (body weight 417 pounds).  Patient has been maintained on auto CPAP for the last 6 years.  Patient has a DreamStation 2 auto CPAP, machine stopped working about 10 days ago.  Prior to this he was 100% compliant with use and reported benefit from wearing.  Since machine stopped working he has not been sleeping well, snoring more.  His partner has been having to sleep in a separate bedroom.  He does dose off easily if inactive.  He does operate heavy machinery. He has not fallen asleep while driving. He had a DOT physical 2 months ago.  DME company is [facility].  [facility] summary report 06/26/2022 - 09/26/2022 Average usage 100% (97% > 4 hours) Average usage 6 hours 17 minutes Pressure 4 to 20 cm H2O (12.8 cm H2O-95%) AHI 1  OSA (obstructive sleep apnea) - Patient is 97% compliant with CPAP use > 4 hours over the last 30 days.  Reports benefit in sleep quality and fatigue with use. Current CPAP machine is greater than 61 years old and stopped working 10 days ago. Pressure auto setting 4 - 20 cm H2O; Residual AHI 1.0/hour.  DME order placed for patient to receive new CPAP machine through [facility]. No changes to pressure settings today.   Advised patient aim to wear CPAP nightly for 6 hours or longer.    Follow-up in 31 to 90 days for CPAP compliance check.    01/07/2023- INTERIM  Patient presents today for 71-month follow-up.  During her last office visit an order was placed for patient to receive new CPAP machine.  He presents today for compliance check.        Observations/Objective:   Assessment and Plan:   Follow Up Instructions:    I discussed the assessment and treatment plan with the patient. The patient was provided an opportunity to ask questions and all were answered. The patient agreed with the plan and demonstrated an understanding of the instructions.   The patient was advised to call back or seek an in-person evaluation if the symptoms worsen or if the condition fails to improve as anticipated.  I provided *** minutes of non-face-to-face time during this encounter.   Glenford Bayley, NP

## 2023-01-06 NOTE — Progress Notes (Deleted)
@Patient  ID: Alexander [name], male    DOB: 1990-12-02, 32 y.o.   MRN: [id number]  No chief complaint on file.   Referring provider: Jarold Motto, PA  HPI:  32 year old male, former smoker quit in 2019.  Past medical history significant for sleep apnea.  Patient of Dr. Maple Hudson, last seen in 2021.  10/07/2022 Patient presents today for overdue follow-up for OSA. Home sleep study in September 2018 showed severe obstructive sleep apnea, AHI 30.3 an hour with SpO2 low 74% (body weight 417 pounds).  Patient has been maintained on auto CPAP for the last 6 years.  Patient has a DreamStation 2 auto CPAP, machine stopped working about 10 days ago.  Prior to this he was 100% compliant with use and reported benefit from wearing.  Since machine stopped working he has not been sleeping well, snoring more.  His partner has been having to sleep in a separate bedroom.  He does dose off easily if inactive.  He does operate heavy machinery. He has not fallen asleep while driving. He had a DOT physical 2 months ago.  DME company is [facility].  [facility] summary report 06/26/2022 - 09/26/2022 Average usage 100% (97% > 4 hours) Average usage 6 hours 17 minutes Pressure 4 to 20 cm H2O (12.8 cm H2O-95%) AHI 1  OSA (obstructive sleep apnea) - Patient is 97% compliant with CPAP use > 4 hours over the last 30 days.  Reports benefit in sleep quality and fatigue with use. Current CPAP machine is greater than 34 years old and stopped working 10 days ago. Pressure auto setting 4 - 20 cm H2O; Residual AHI 1.0/hour.  DME order placed for patient to receive new CPAP machine through [facility]. No changes to pressure settings today.  Advised patient aim to wear CPAP nightly for 6 hours or longer.    Follow-up in 31 to 90 days for CPAP compliance check.    01/07/2023- INTERIM  Patient presents today for 34-month follow-up.  During her last office visit an order was placed for patient to receive new CPAP machine.  He presents today  for compliance check.        No Known Allergies   There is no immunization history on file for this patient.  Past Medical History:  Diagnosis Date   Anxiety    Hyperlipidemia    Hypertension    Low testosterone    Obstructive sleep apnea    non compliant see mida     Tobacco History: Social History   Tobacco Use  Smoking Status Former   Current packs/day: 0.00   Average packs/day: 1 pack/day for 10.0 years (10.0 ttl pk-yrs)   Types: Cigarettes   Start date: 11/17/2007   Quit date: 11/16/2017   Years since quitting: 5.1  Smokeless Tobacco Never   Counseling given: Not Answered   Outpatient Medications Prior to Visit  Medication Sig Dispense Refill   Multiple Vitamins-Minerals (MULTIVITAMIN MEN) TABS Take 1 tablet by mouth daily.     No facility-administered medications prior to visit.      Review of Systems  Review of Systems   Physical Exam  There were no vitals taken for this visit. Physical Exam   Lab Results:  CBC    Component Value Date/Time   WBC 4.6 11/17/2022 1509   RBC 5.56 11/17/2022 1509   HGB 14.4 11/17/2022 1509   HGB 15.6 05/02/2019 1413   HCT 44.5 11/17/2022 1509   HCT 49.0 05/02/2019 1413   PLT 211.0 11/17/2022 1509  PLT 210 05/02/2019 1413   MCV 80.1 11/17/2022 1509   MCV 80 05/02/2019 1413   MCH 25.5 (L) 05/02/2019 1413   MCH 25.6 (L) 07/01/2016 1306   MCHC 32.2 11/17/2022 1509   RDW 14.9 11/17/2022 1509   RDW 14.6 05/02/2019 1413   LYMPHSABS 1.8 11/17/2022 1509   LYMPHSABS 1.5 05/02/2019 1413   MONOABS 0.6 11/17/2022 1509   EOSABS 0.1 11/17/2022 1509   EOSABS 0.1 05/02/2019 1413   BASOSABS 0.0 11/17/2022 1509   BASOSABS 0.0 05/02/2019 1413    BMET    Component Value Date/Time   NA 138 11/17/2022 1509   NA 138 05/02/2019 1413   K 4.2 11/17/2022 1509   CL 102 11/17/2022 1509   CO2 30 11/17/2022 1509   GLUCOSE 86 11/17/2022 1509   BUN 14 11/17/2022 1509   BUN 9 05/02/2019 1413   CREATININE 1.05 11/17/2022  1509   CALCIUM 9.4 11/17/2022 1509   GFRNONAA 108 05/02/2019 1413   GFRAA 125 05/02/2019 1413    BNP No results found for: "BNP"  ProBNP No results found for: "PROBNP"  Imaging: No results found.   Assessment & Plan:   No problem-specific Assessment & Plan notes found for this encounter.     Glenford Bayley, NP 01/06/2023

## 2023-01-07 ENCOUNTER — Telehealth: Payer: Managed Care, Other (non HMO) | Admitting: Primary Care

## 2023-01-07 DIAGNOSIS — G4733 Obstructive sleep apnea (adult) (pediatric): Secondary | ICD-10-CM | POA: Diagnosis not present

## 2023-06-11 ENCOUNTER — Telehealth: Payer: Self-pay | Admitting: *Deleted

## 2023-06-11 NOTE — Telephone Encounter (Signed)
 Copied from CRM [id number]. Topic: General - Other >> Jun 11, 2023  1:25 PM Caliyah H wrote: Reason for CRM: Patient called today regarding his DOT physical. He is requesting to know if his PCP can review his readings to determine eligibility, as he typically has them read at his medical supply provider. However, he was informed that the staff who usually review the readings are out of the office until Monday. His DOT physical is due today and he is concerned about the delay.  Callback Number: [phone]

## 2023-06-11 NOTE — Telephone Encounter (Signed)
 Spoke to pt told him providers are not allowed to review other providers lab results with you, you must get results from provider who ordered them. Also we do not do DOT physicals here in the office. Pt verbalized understanding.
